# Patient Record
Sex: Male | Born: 1935 | Race: White | Hispanic: No | Smoking: Former smoker
Health system: Southern US, Community
[De-identification: ages and names within clinical notes are randomized; demographics above are authoritative.]

## PROBLEM LIST (undated history)

## (undated) DIAGNOSIS — C689 Malignant neoplasm of urinary organ, unspecified: Secondary | ICD-10-CM

## (undated) DIAGNOSIS — I4891 Unspecified atrial fibrillation: Secondary | ICD-10-CM

## (undated) DIAGNOSIS — I7122 Aneurysm of the aortic arch, without rupture: Secondary | ICD-10-CM

## (undated) DIAGNOSIS — C439 Malignant melanoma of skin, unspecified: Secondary | ICD-10-CM

## (undated) DIAGNOSIS — E785 Hyperlipidemia, unspecified: Secondary | ICD-10-CM

## (undated) DIAGNOSIS — C799 Secondary malignant neoplasm of unspecified site: Principal | ICD-10-CM

## (undated) DIAGNOSIS — K5100 Ulcerative (chronic) pancolitis without complications: Secondary | ICD-10-CM

## (undated) DIAGNOSIS — R918 Other nonspecific abnormal finding of lung field: Secondary | ICD-10-CM

## (undated) DIAGNOSIS — K4090 Unilateral inguinal hernia, without obstruction or gangrene, not specified as recurrent: Secondary | ICD-10-CM

## (undated) DIAGNOSIS — I712 Thoracic aortic aneurysm, without rupture: Secondary | ICD-10-CM

## (undated) DIAGNOSIS — I359 Nonrheumatic aortic valve disorder, unspecified: Secondary | ICD-10-CM

## (undated) DIAGNOSIS — I10 Essential (primary) hypertension: Secondary | ICD-10-CM

## (undated) HISTORY — PX: AORTIC ARCH REPAIR: SHX256

## (undated) HISTORY — DX: Secondary malignant neoplasm of unspecified site: C79.9

## (undated) HISTORY — PX: AORTIC VALVE SURGERY: SHX549

## (undated) HISTORY — DX: Other nonspecific abnormal finding of lung field: R91.8

## (undated) HISTORY — PX: INGUINAL HERNIA REPAIR: SUR1180

## (undated) HISTORY — DX: Malignant neoplasm of urinary organ, unspecified: C68.9

## (undated) HISTORY — DX: Malignant melanoma of skin, unspecified: C43.9

## (undated) HISTORY — DX: Ulcerative (chronic) pancolitis without complications: K51.00

---

## 1975-04-19 HISTORY — PX: THORACOTOMY: SUR1349

## 1998-04-18 HISTORY — PX: CORONARY ARTERY BYPASS GRAFT: SHX141

## 1998-09-14 ENCOUNTER — Ambulatory Visit: Admission: RE | Admit: 1998-09-14 | Discharge: 1998-09-14 | Payer: Self-pay | Admitting: Internal Medicine

## 1999-03-02 ENCOUNTER — Encounter: Payer: Self-pay | Admitting: Thoracic Surgery (Cardiothoracic Vascular Surgery)

## 1999-03-03 ENCOUNTER — Inpatient Hospital Stay (HOSPITAL_COMMUNITY): Admission: RE | Admit: 1999-03-03 | Discharge: 1999-03-10 | Payer: Self-pay | Admitting: *Deleted

## 1999-03-04 ENCOUNTER — Encounter: Payer: Self-pay | Admitting: Thoracic Surgery (Cardiothoracic Vascular Surgery)

## 1999-03-05 ENCOUNTER — Encounter: Payer: Self-pay | Admitting: Thoracic Surgery (Cardiothoracic Vascular Surgery)

## 1999-03-06 ENCOUNTER — Encounter: Payer: Self-pay | Admitting: Thoracic Surgery (Cardiothoracic Vascular Surgery)

## 1999-03-07 ENCOUNTER — Encounter: Payer: Self-pay | Admitting: Thoracic Surgery (Cardiothoracic Vascular Surgery)

## 1999-03-08 ENCOUNTER — Encounter: Payer: Self-pay | Admitting: Cardiothoracic Surgery

## 1999-03-09 ENCOUNTER — Encounter: Payer: Self-pay | Admitting: Cardiothoracic Surgery

## 1999-03-25 ENCOUNTER — Other Ambulatory Visit: Admission: RE | Admit: 1999-03-25 | Discharge: 1999-03-25 | Payer: Self-pay | Admitting: Internal Medicine

## 1999-09-20 ENCOUNTER — Encounter: Payer: Self-pay | Admitting: Internal Medicine

## 1999-09-20 ENCOUNTER — Encounter: Admission: RE | Admit: 1999-09-20 | Discharge: 1999-09-20 | Payer: Self-pay | Admitting: Internal Medicine

## 1999-11-03 ENCOUNTER — Ambulatory Visit (HOSPITAL_COMMUNITY): Admission: RE | Admit: 1999-11-03 | Discharge: 1999-11-03 | Payer: Self-pay | Admitting: Gastroenterology

## 2004-03-02 ENCOUNTER — Ambulatory Visit (HOSPITAL_COMMUNITY): Admission: RE | Admit: 2004-03-02 | Discharge: 2004-03-02 | Payer: Self-pay | Admitting: Gastroenterology

## 2005-08-23 DIAGNOSIS — C439 Malignant melanoma of skin, unspecified: Secondary | ICD-10-CM

## 2005-08-23 HISTORY — DX: Malignant melanoma of skin, unspecified: C43.9

## 2012-06-12 ENCOUNTER — Other Ambulatory Visit: Payer: Self-pay | Admitting: Internal Medicine

## 2012-06-13 ENCOUNTER — Other Ambulatory Visit: Payer: Self-pay | Admitting: Internal Medicine

## 2012-06-13 DIAGNOSIS — R319 Hematuria, unspecified: Secondary | ICD-10-CM

## 2012-06-14 ENCOUNTER — Ambulatory Visit
Admission: RE | Admit: 2012-06-14 | Discharge: 2012-06-14 | Disposition: A | Discharge status: Home / Self Care | Payer: Medicare Other | Source: Ambulatory Visit | Attending: Internal Medicine | Admitting: Internal Medicine

## 2012-06-14 MED ORDER — IOHEXOL 300 MG/ML  SOLN
75.0000 mL | Freq: Once | INTRAMUSCULAR | Status: AC | PRN
  Administered 2012-06-14: 75 mL via INTRAVENOUS

## 2012-06-15 ENCOUNTER — Ambulatory Visit
Admission: RE | Admit: 2012-06-15 | Discharge: 2012-06-15 | Disposition: A | Discharge status: Home / Self Care | Payer: Medicare Other | Source: Ambulatory Visit | Attending: Internal Medicine | Admitting: Internal Medicine

## 2012-06-15 ENCOUNTER — Other Ambulatory Visit: Payer: Self-pay | Admitting: Radiology

## 2012-06-15 ENCOUNTER — Other Ambulatory Visit (HOSPITAL_COMMUNITY): Payer: Self-pay | Admitting: Internal Medicine

## 2012-06-18 ENCOUNTER — Encounter (HOSPITAL_COMMUNITY): Payer: Self-pay | Admitting: Pharmacy Technician

## 2012-06-19 ENCOUNTER — Ambulatory Visit (HOSPITAL_COMMUNITY)
Admission: RE | Admit: 2012-06-19 | Discharge: 2012-06-19 | Disposition: A | Discharge status: Home / Self Care | Payer: Medicare Other | Source: Ambulatory Visit | Attending: Internal Medicine | Admitting: Internal Medicine

## 2012-06-19 ENCOUNTER — Other Ambulatory Visit (HOSPITAL_COMMUNITY): Payer: Self-pay | Admitting: Internal Medicine

## 2012-06-19 ENCOUNTER — Encounter (HOSPITAL_COMMUNITY): Payer: Self-pay

## 2012-06-19 ENCOUNTER — Ambulatory Visit (HOSPITAL_COMMUNITY)
Admission: RE | Admit: 2012-06-19 | Discharge: 2012-06-19 | Disposition: A | Discharge status: Home / Self Care | Payer: Medicare Other | Source: Ambulatory Visit | Attending: Interventional Radiology | Admitting: Interventional Radiology

## 2012-06-19 VITALS — BP 106/42 | HR 74 | Temp 97.9°F | Resp 20 | Ht 70.5 in | Wt 135.0 lb

## 2012-06-19 DIAGNOSIS — R222 Localized swelling, mass and lump, trunk: Secondary | ICD-10-CM | POA: Insufficient documentation

## 2012-06-19 HISTORY — DX: Essential (primary) hypertension: I10

## 2012-06-19 HISTORY — DX: Nonrheumatic aortic valve disorder, unspecified: I35.9

## 2012-06-19 HISTORY — DX: Thoracic aortic aneurysm, without rupture: I71.2

## 2012-06-19 HISTORY — DX: Hyperlipidemia, unspecified: E78.5

## 2012-06-19 HISTORY — DX: Aneurysm of the aortic arch, without rupture: I71.22

## 2012-06-19 LAB — CBC
HCT: 36.7 % — ABNORMAL LOW (ref 39.0–52.0)
Hemoglobin: 12.9 g/dL — ABNORMAL LOW (ref 13.0–17.0)
MCV: 89.7 fL (ref 78.0–100.0)
RDW: 13.5 % (ref 11.5–15.5)
WBC: 6.1 10*3/uL (ref 4.0–10.5)

## 2012-06-19 LAB — APTT: aPTT: 30 seconds (ref 24–37)

## 2012-06-19 LAB — PROTIME-INR: INR: 1.03 (ref 0.00–1.49)

## 2012-06-19 MED ORDER — SODIUM CHLORIDE 0.9 % IV SOLN
Freq: Once | INTRAVENOUS | Status: AC
  Administered 2012-06-19: 08:00:00 via INTRAVENOUS

## 2012-06-19 MED ORDER — LORAZEPAM 2 MG/ML IJ SOLN
INTRAMUSCULAR | Status: AC | PRN
  Administered 2012-06-19: 1 mg via INTRAVENOUS

## 2012-06-19 MED ORDER — MIDAZOLAM HCL 2 MG/2ML IJ SOLN
INTRAMUSCULAR | Status: AC
  Filled 2012-06-19: qty 4

## 2012-06-19 MED ORDER — FENTANYL CITRATE 0.05 MG/ML IJ SOLN
INTRAMUSCULAR | Status: AC | PRN
  Administered 2012-06-19: 25 ug via INTRAVENOUS
  Administered 2012-06-19: 50 ug via INTRAVENOUS
  Administered 2012-06-19: 25 ug via INTRAVENOUS

## 2012-06-19 MED ORDER — FENTANYL CITRATE 0.05 MG/ML IJ SOLN
INTRAMUSCULAR | Status: AC
  Filled 2012-06-19: qty 4

## 2012-06-19 MED ORDER — MIDAZOLAM HCL 2 MG/2ML IJ SOLN
INTRAMUSCULAR | Status: AC | PRN
  Administered 2012-06-19: 1 mg via INTRAVENOUS

## 2012-06-19 NOTE — Procedures (Signed)
Successful CT guided left sided thoracentesis with 800 cc of serous, slightly bloody pleural fluid aspirated.   Technically successful CT guided biopsy of left adrenal gland mass.  No immediate post procedural complications.

## 2012-06-19 NOTE — Progress Notes (Signed)
Removed 850 ml sero-sanguanous fluid via thoracentesis per Dr Grace Isaac

## 2012-06-19 NOTE — Discharge Instructions (Signed)
Thoracentesis A thoracentesis is a procedure to remove excess fluid from the space around the lungs (pleural space). Normally, the pleural space has only a small amount of fluid. Excess fluid in the pleural space can be the result of certain conditions, such as infection, inflammation, heart failure, or cancer. The excess fluid is removed using a needle inserted through the skin and tissue into the pleural space.  A thoracentesis may be done to:  Determine the cause of the excess fluid by examining the fluid.  Relieve symptoms of shortness of breath or pain caused by the excess fluid. LET YOUR CAREGIVER KNOW ABOUT:  Allergies.  Medicines taken including herbs, eyedrops, over-the-counter medicines, and creams.  Use of steroids (by mouth or creams).  Previous problems with anesthetics or numbing medicine.  Possibility of pregnancy, if this applies.  History of blood clots (thrombophlebitis).  History of bleeding or blood problems.  Previous surgery.  Other health problems. RISKS AND COMPLICATIONS  Injury to the lung.  Possible infection.  Possible collapse of a lung (pneumothorax). BEFORE THE PROCEDURE Ask your caregiver if you need to arrive early before your procedure. Inform your caregiver if you have had a frequent cough. If you have had frequent coughing episodes, your caregiver may want you to take a cough suppressant. You may have a chest X-ray to determine location and amount of fluid in the pleural space. PROCEDURE The procedure will take about 30 minutes. This time will vary depending on the amount of fluid that is removed. You may be asked to sit upright and lean slightly forward for the procedure. An area of the back will be cleansed. A numbing medicine (local anesthetic) may then be injected into the skin and tissue. A needle is inserted between your ribs and advanced into the pleural space. You may feel pressure or slight pain as the needle is positioned into the  pleural space. Fluid is removed from the pleural space through the needle. You may feel pressure as the fluid is removed. The needle is withdrawn once the excess fluid has been removed. A sample of the fluid may be sent for examination.  AFTER THE PROCEDURE A chest X-ray may be done. Your recovery will be assessed and monitored. If there are no problems, you should be able to go home shortly after the procedure. Ask when your test results will be ready. Make sure you get your test results. HOME CARE INSTRUCTIONS   You may resume normal diet and activities as allowed.  Only take over-the-counter or prescription medicines for pain, discomfort, or fever as directed by your caregiver. SEEK IMMEDIATE MEDICAL CARE IF:   You develop shortness of breath or chest pain.  There is new drainage or pus coming from the site where fluid was removed.  You notice swelling or increased redness from the site where fluid was removed.  An unexplained temperature of 102 F (38.9 C) or above develops. Document Released: 10/18/2004 Document Revised: 06/27/2011 Document Reviewed: 01/31/2008 [facility] Patient Information 2013 [place], [place].  Biopsy Care After These instructions give you information on caring for yourself after your procedure. Your doctor may also give you more specific instructions. Call your doctor if you have any problems or questions after your procedure. HOME CARE   Return to your normal diet and activities as told by your doctor.  Change your bandages (dressings) as told by your doctor. If skin glue (adhesive) was used, it will peel off in 7 days.  Only take medicines as told by your  doctor.  Ask your doctor when you can bathe and get your wound wet. GET HELP RIGHT AWAY IF:  You see more than a small spot of blood coming from the wound.  You have redness, puffiness (swelling), or pain.  You see yellowish-white fluid (pus) coming from the wound.  You have a fever.  You notice a  bad smell coming from the wound or bandage.  You have a rash, trouble breathing, or any allergy problems. MAKE SURE YOU:   Understand these instructions.  Will watch your condition.  Will get help right away if you are not doing well or get worse. Document Released: 12/07/2010 Document Revised: 06/27/2011 Document Reviewed: 12/07/2010 [facility] Patient Information 2013 [place], [place].

## 2012-06-19 NOTE — H&P (Signed)
Chief Complaint: "I'm here for a biopsy" Referring Physician:Gates HPI: Sean Hunt is an 79 y.o. male with newly found left lung and left adrenal masses. He has been referred for lung biiopsy. He is on Coumadin for his aortic valve repair and has been told to hold his Coumadin for the past 5 days. Upon review of his CT, there is also a moderate pleural effusion which would increase the risk of complications from lung lesion biopsy and the adrenal gland mass seems amenable for biopsy. We discussed with the pt the possibility of performing bx of adrenal lesion as a less risky option, however, we may need to perform thoracentesis and lung biopsy based on today's imaging. PMHx and meds reviewed. The pt feels well otherwise.  Past Medical History:  Past Medical History  Diagnosis Date  . Aortic arch aneurysm   . Aortic valve disorder   . HTN (hypertension)   . Hyperlipidemia     Past Surgical History:  Past Surgical History  Procedure Laterality Date  . Aortic valve surgery    . Aortic arch repair    . Inguinal hernia repair    . Thoracotomy Left 1977    small partial resection    Family History: No family history on file.  Social History:  reports that he has quit smoking. His smoking use included Cigarettes. He smoked 0.00 packs per day. He does not have any smokeless tobacco history on file. He reports that he does not drink alcohol or use illicit drugs.  Allergies: No Known Allergies  Medications: amoxicillin (AMOXIL) 500 MG capsule (Taking) Sig - Route: Take 2,000 mg by mouth once. Pt takes 4 capsules 1 hour prior to dental procedure - Oral Class: Historical Med Number of times this order has been changed since signing: 2 Order [address] aspirin EC 81 MG tablet (Taking) Sig - Route: Take 81 mg by mouth daily. - Oral Class: Historical Med Number of times this order has been changed since signing: 1 Order [address] cholecalciferol (VITAMIN D) 1000 UNITS tablet (Taking) Sig -  Route: Take 1,000 Units by mouth daily. - Oral Class: Historical Med Number of times this order has been changed since signing: 1 Order [address] metoprolol (LOPRESSOR) 50 MG tablet (Taking) Sig - Route: Take 25 mg by mouth daily. - Oral Class: Historical Med Number of times this order has been changed since signing: 1 Order [address] Multiple Vitamin (MULTIVITAMIN WITH MINERALS) TABS (Taking) Sig - Route: Take 1 tablet by mouth daily. - Oral Class: Historical Med Number of times this order has been changed since signing: 1 Order [address] rosuvastatin (CRESTOR) 20 MG tablet (Taking) Sig - Route: Take 20 mg by mouth daily. - Oral Class: Historical Med Number of times this order has been changed since signing: 1 Order [address] warfarin (COUMADIN) 5 MG tablet (Taking) Sig - Route: Take 5-7.5 mg by mouth daily. Sun, mon, wed, thu, fri 1.5 tabs and tue, Sat 1 tab - Oral Class: Historical Med   Please HPI for pertinent positives, otherwise complete 10 system ROS negative.  Physical Exam: Blood pressure 144/81, pulse 95, temperature 97.5 F (36.4 C), temperature source Oral, resp. rate 18, height 5' 10.5" (1.791 m), weight 135 lb (61.236 kg), SpO2 99.00%. Body mass index is 19.09 kg/(m^2).   General Appearance:  Alert, cooperative, no distress, appears stated age  Head:  Normocephalic, without obvious abnormality, atraumatic  ENT: Unremarkable  Lungs:   Clear to auscultation bilaterally, no w/r/r, respirations  unlabored without use of accessory muscles.  Chest Wall:  No tenderness or deformity, left lateral scar from prior thoracotomy  Heart:  Regular rate and rhythm, S1, S2 normal, no murmur, rub or gallop. Carotids 2+ without bruit.  Abdomen:   Soft, non-tender, non distended. Bowel sounds active all four quadrants,  no masses, no organomegaly.  Neurologic: Normal affect, no gross deficits.   No results found for this or any previous visit (from the past 48 hour(s)). No results  found.  Assessment/Plan (L)lung mass and left adrenal mass. Plan for CT guided left adrenal mass biopsy, possible left thoracentesis and lung biopsy. Discussed procedures and risks of both, including bleeding, PTX, need for chest tube. Discussed recovery of procedure as well. Labs pending Consent signed in chart  Brayton El PA-C 06/19/2012, 8:43 AM

## 2012-06-20 ENCOUNTER — Telehealth (HOSPITAL_COMMUNITY): Payer: Self-pay | Admitting: *Deleted

## 2012-07-06 ENCOUNTER — Encounter: Payer: Self-pay | Admitting: Oncology

## 2012-07-06 ENCOUNTER — Other Ambulatory Visit: Payer: Medicare Other | Admitting: Lab

## 2012-07-06 ENCOUNTER — Ambulatory Visit: Payer: Medicare Other | Admitting: Oncology

## 2012-07-06 ENCOUNTER — Ambulatory Visit: Payer: Medicare Other

## 2012-07-06 ENCOUNTER — Telehealth: Payer: Self-pay | Admitting: Oncology

## 2012-07-06 DIAGNOSIS — R918 Other nonspecific abnormal finding of lung field: Secondary | ICD-10-CM

## 2012-07-06 DIAGNOSIS — C439 Malignant melanoma of skin, unspecified: Secondary | ICD-10-CM

## 2012-07-06 DIAGNOSIS — C799 Secondary malignant neoplasm of unspecified site: Secondary | ICD-10-CM

## 2012-07-06 HISTORY — DX: Malignant melanoma of skin, unspecified: C43.9

## 2012-07-06 HISTORY — DX: Secondary malignant neoplasm of unspecified site: C79.9

## 2012-07-06 HISTORY — DX: Other nonspecific abnormal finding of lung field: R91.8

## 2012-07-06 NOTE — Telephone Encounter (Signed)
S/W PT IN RE NP APPT 03/24 @ 3:45 W/DR. HA.  REFERRING DR. Molly Maduro GATES DX- PROBABLE RECURRENT MELANOMA FROM ADRENAL BIOPSY.  WELCOME PACKET IN-OFFICE.

## 2012-07-08 NOTE — Patient Instructions (Addendum)
1.  diagnosis: Metastatic melanoma. 2.  Workup: Brain MRI to rule out spread to the brain, consider PET scan to determine complete stage.  I will ask pathologist to see if tumor has Braf mutation which will correspond to ovral chemo.  3.  Treatment:   *   Yervoy IV once every 3 weeks x 4 doses.  Goal:  Control disease and potential increase survival.   *   Potential side effects include but not limited to:  Autoimmune side effects skin rash, diarrhea, bleeding, liver abnormality, low thyroid function.  *  Clinical trial at an outside institution like [facility] or [place].

## 2012-07-09 ENCOUNTER — Other Ambulatory Visit (HOSPITAL_BASED_OUTPATIENT_CLINIC_OR_DEPARTMENT_OTHER): Payer: Medicare Other | Admitting: Lab

## 2012-07-09 ENCOUNTER — Encounter: Payer: Self-pay | Admitting: Oncology

## 2012-07-09 ENCOUNTER — Ambulatory Visit (HOSPITAL_BASED_OUTPATIENT_CLINIC_OR_DEPARTMENT_OTHER): Payer: Medicare Other | Admitting: Oncology

## 2012-07-09 ENCOUNTER — Ambulatory Visit: Payer: Medicare Other

## 2012-07-09 ENCOUNTER — Telehealth: Payer: Self-pay | Admitting: Oncology

## 2012-07-09 VITALS — BP 134/79 | HR 74 | Temp 97.7°F | Resp 18 | Ht 69.5 in | Wt 138.2 lb

## 2012-07-09 DIAGNOSIS — J90 Pleural effusion, not elsewhere classified: Secondary | ICD-10-CM

## 2012-07-09 DIAGNOSIS — C799 Secondary malignant neoplasm of unspecified site: Secondary | ICD-10-CM

## 2012-07-09 DIAGNOSIS — R918 Other nonspecific abnormal finding of lung field: Secondary | ICD-10-CM

## 2012-07-09 DIAGNOSIS — C7490 Malignant neoplasm of unspecified part of unspecified adrenal gland: Secondary | ICD-10-CM

## 2012-07-09 DIAGNOSIS — N3289 Other specified disorders of bladder: Secondary | ICD-10-CM

## 2012-07-09 DIAGNOSIS — R222 Localized swelling, mass and lump, trunk: Secondary | ICD-10-CM

## 2012-07-09 DIAGNOSIS — Z85820 Personal history of malignant melanoma of skin: Secondary | ICD-10-CM

## 2012-07-09 LAB — CBC WITH DIFFERENTIAL/PLATELET
Basophils Absolute: 0 10*3/uL (ref 0.0–0.1)
Eosinophils Absolute: 0 10*3/uL (ref 0.0–0.5)
HGB: 12.9 g/dL — ABNORMAL LOW (ref 13.0–17.1)
MONO#: 0.6 10*3/uL (ref 0.1–0.9)
NEUT#: 6.5 10*3/uL (ref 1.5–6.5)
RBC: 4.23 10*6/uL (ref 4.20–5.82)
RDW: 13.6 % (ref 11.0–14.6)
WBC: 7.8 10*3/uL (ref 4.0–10.3)
lymph#: 0.6 10*3/uL — ABNORMAL LOW (ref 0.9–3.3)

## 2012-07-09 LAB — COMPREHENSIVE METABOLIC PANEL (CC13)
Albumin: 3.4 g/dL — ABNORMAL LOW (ref 3.5–5.0)
BUN: 17.6 mg/dL (ref 7.0–26.0)
Calcium: 9.6 mg/dL (ref 8.4–10.4)
Chloride: 103 mEq/L (ref 98–107)
Glucose: 95 mg/dl (ref 70–99)
Potassium: 4.4 mEq/L (ref 3.5–5.1)
Sodium: 143 mEq/L (ref 136–145)
Total Protein: 7.3 g/dL (ref 6.4–8.3)

## 2012-07-09 NOTE — Progress Notes (Signed)
[facility]  Telephone:([phone] [phone]   MEDICAL ONCOLOGY - INITIAL CONSULATION    Referral MD:  Dr. Marden Noble, M.D.   Reason for Referral: Suspected metastatic melanoma.    HPI:  [name] is an 79 year old man with history of melanoma. He recently developed nonintentional weight loss since November 2013 about 30 pounds. He had normal appetite. He developed left upper quadrant pain was crampy. Eventually he underwent workup with CT scan of the chest on 06/14/2012 which showed a 5.1 cm rounded mass in the posterior medial left lower lobe. There was moderate sized left pleural effusion. There was no definite mediastinal or hilar adenopathy. There was an enlarged left adrenal gland.  He underwent on 06/19/2012 left thoracentesis with negative cytology. He had CT-guided left adrenal gland biopsy on 06/19/2012 with pathology case number [id number] which showed high-grade malignancy. By [facility]. staining, the cells were positive for S100 and negative for cytokeratin 5/6, cytokeratin 7, cytokeratin AE1/AE3, HMB 45, TTF-1, melan-A.  This was thought to be consistent with melanoma. He was thus kindly referred to the cancer Center for evaluation.  Sean Hunt presented to the clinic today for the first time today with his daughter.  He reported feeling well. He has mild left flank pain and left upper quadrant pain. The pain is crampy. It is mild. It is only chronic pain medication. He does not radiate. It is stable since past 3 months. He is not smoking about the timing of his melanoma diagnosis in the sternum.  He gave me the contact for his dermatologist Dr. Ellen Henri.  He does have occasional dizziness without syncope, frequent fall, gait abnormality. He is still independent of all activities daily living.  Patient denies fever, anorexia,  fatigue, headache, visual changes, confusion, drenching night sweats, palpable lymph node swelling, mucositis, odynophagia,  dysphagia, nausea vomiting, jaundice, palpitation, shortness of breath, dyspnea on exertion, productive cough, gum bleeding, epistaxis, hematemesis, hemoptysis, abdominal swelling, early satiety, melena, hematochezia, hematuria, skin rash, spontaneous bleeding, joint swelling, joint pain, heat or cold intolerance, bowel bladder incontinence, back pain, focal motor weakness, paresthesia, depression.      Past Medical History  Diagnosis Date  . Aortic arch aneurysm   . Aortic valve disorder   . HTN (hypertension)   . Hyperlipidemia   . Metastatic melanoma 07/06/2012  . Lung mass 07/06/2012  :  Past Surgical History  Procedure Laterality Date  . Aortic valve surgery    . Aortic arch repair    . Inguinal hernia repair    . Thoracotomy Left 1977    small partial resection, for "clot?"  . Coronary artery bypass graft  2000  :  Current Outpatient Prescriptions  Medication Sig Dispense Refill  . amoxicillin (AMOXIL) 500 MG capsule Take 2,000 mg by mouth once. Pt takes 4 capsules 1 hour prior to dental procedure      . aspirin EC 81 MG tablet Take 81 mg by mouth daily.      . cholecalciferol (VITAMIN D) 1000 UNITS tablet Take 1,000 Units by mouth daily.      . metoprolol (LOPRESSOR) 50 MG tablet Take 25 mg by mouth daily.      . Multiple Vitamin (MULTIVITAMIN WITH MINERALS) TABS Take 1 tablet by mouth daily.      . rosuvastatin (CRESTOR) 20 MG tablet Take 20 mg by mouth daily.      Marland Kitchen warfarin (COUMADIN) 5 MG tablet Take 5-7.5 mg by mouth daily. Sun, mon, wed,  thu, fri 1.5 tabs and tue,  Sat 1 tab       No current facility-administered medications for this visit.     No Known Allergies:  Family History  Problem Relation Age of Onset  . Alzheimer's disease Mother   . Stroke Father   . Hypertension Father   . Cancer Sister     breast cancer;   . Cancer Brother     bladder cancer; lung cancer; melanoma.   :  History   Social History  . Marital Status: Widowed    Spouse Name:  N/A    Number of Children: 3  . Years of Education: N/A   Occupational History  . retired     [employer].    Social History Main Topics  . Smoking status: Former Smoker -- 1.00 packs/day for 25 years    Types: Cigarettes    Quit date: 04/19/1975  . Smokeless tobacco: Not on file  . Alcohol Use: No     Comment: Remote history  . Drug Use: No  . Sexually Active: Not on file   Other Topics Concern  . Not on file   Social History Narrative  . No narrative on file  :   Exam: ECOG 0   General:  well-nourished in no acute distress.  Eyes:  no scleral icterus.  ENT:  There were no oropharyngeal lesions.  Neck was without thyromegaly.  Lymphatics:  Negative cervical, supraclavicular or axillary adenopathy.  Respiratory: There was mild story crackle in the left lower base a decrease in tympany. Cardiovascular:  Irregularly irregular, S1/S2, without murmur, rub or gallop.  There was no pedal edema.  GI:  abdomen was soft, flat, nontender, nondistended, without organomegaly.  Muscoloskeletal:  no spinal tenderness of palpation of vertebral spine.  Skin exam was without echymosis, petichae.  Neuro exam was nonfocal.  Patient was able to get on and off exam table without assistance.  Gait was normal.  Patient was alerted and oriented.  Attention was good.   Language was appropriate.  Mood was normal without depression.  Speech was not pressured.  Thought content was not tangential.     Lab Results  Component Value Date   WBC 7.8 07/09/2012   HGB 12.9* 07/09/2012   HCT 38.0* 07/09/2012   PLT 178 07/09/2012    Dg Chest 1 View  06/19/2012  *RADIOLOGY REPORT*  Clinical Data: Post thoracentesis  CHEST - 1 VIEW  Comparison: 06/12/2012  Findings: Cardiomediastinal silhouette is stable.  Status post CABG again noted.  Poorly visualized nodular mass left lower lobe retrocardiac again noted.  Small residual left pleural effusion with left basilar atelectasis.  No diagnostic pneumothorax.  No pulmonary  edema.  IMPRESSION: Status post CABG again noted.  Poorly visualized nodular mass left lower lobe retrocardiac again noted.  Small residual left pleural effusion with left basilar atelectasis.  No diagnostic pneumothorax.  No pulmonary edema.   Original Report Authenticated By: Natasha Mead, M.D.    Ct Chest W Contrast  06/14/2012  *RADIOLOGY REPORT*  Clinical Data: Abnormal chest x-ray with a rounded mass-like opacity at the left lung base on recent chest x-ray  CT CHEST WITH CONTRAST  Technique:  Multidetector CT imaging of the chest was performed following the standard protocol during bolus administration of intravenous contrast.  Contrast: 75mL OMNIPAQUE IOHEXOL 300 MG/ML  SOLN  Comparison: Chest x-ray of 06/12/2012 and 03/02/2012  Findings: At the site questioned by chest x-ray there is a rounded soft tissue mass  within the posterior medial left lower lobe measuring 5.1 x 3.9 x 4.2 cm, consistent with primary lung carcinoma.  There is a moderate-sized left pleural effusion present with some volume loss at the left lung base as well chronic interstitial changes are noted primarily at the lung bases consistent with fibrosis.  Linear scarring is noted in the lung apex left lung apex.  Mild changes of centrilobular emphysema are noted.  No additional lung lesion is evident.  Since the central airway is patent. There is a moderate sized hiatal hernia present. An old healed posterior left fifth rib fracture is noted.  On soft tissue window images, the thyroid gland is unremarkable. The thoracic aorta opacifies with advanced atherosclerotic disease noted. Changes of aortic valve replacement and repair of the ascending aorta is noted.  There is mild cardiomegaly present.  No definite mediastinal or hilar adenopathy is seen.  However, on scans through the upper abdomen there is enlargement of the left adrenal gland measuring 2.9 x 2.8 cm with an attenuation of 47 HU, suspicious for metastatic involvement of the left  adrenal gland. The left upper pole renal cyst is noted posteriorly.  IMPRESSION:  1.  5.1 cm rounded mass in the posterior medial left lower lobe consistent with primary lung carcinoma. 2.  Moderate sized left pleural effusion. 3.  No definite mediastinal or hilar adenopathy. 4.  Moderate sized hiatal hernia. 5.  Enlarged left adrenal gland suspicious for metastatic involvement.   Original Report Authenticated By: Dwyane Dee, M.D.    US Renal  06/15/2012  *RADIOLOGY REPORT*  Clinical Data: Hematuria  RENAL/URINARY TRACT ULTRASOUND COMPLETE  Comparison:  None  Findings:  Right Kidney:  No hydronephrosis is seen.  The right kidney measures 12.8 cm sagittally.  However there does appear to be a soft tissue mass within the proximal right ureter of 2.4 x 1.2 x 1.7 cm.  No definite blood flow is seen, and consideration would be that of blood clot versus possible tumor.  Left Kidney:  No hydronephrosis is seen.  The left kidney measures 10.8 cm.  There is a cyst in the upper pole of 4.0 cm.  Bladder:  The urinary bladder is unremarkable.  Left ureteral jet is present.  Prevoid urine volume is 501 ml, with postvoid volume of 185 ml.  IMPRESSION:  1.  Soft tissue filling defect within the proximal right ureter without definite blood flow.  This may represent a blood clot, but a urothelial tumor cannot be excluded. 2.  No hydronephrosis. 3.  Postvoid urine volume of 185 ml.   Original Report Authenticated By: Dwyane Dee, M.D.     Assessment and Plan:   1.  Diagnosis: Metastatic melanoma. -Workup: Brain MRI to rule out spread to the brain, consider PET scan to determine complete stage.  I will contact his dermatologist for information about this primary melanoma. I will ask pathologist to see if tumor has Braf mutation which will correspond to ovral chemo.    -Treatment:   *   Yervoy IV once every 3 weeks x 4 doses.  Goal:  Control disease and potential increase survival.   *   Potential side effects include but  not limited to:  Autoimmune side effects skin rash, diarrhea, bleeding, liver abnormality, low thyroid function.  *  Clinical trial at an outside institution like [facility] or [place].   2.   left lower lobe lung mass and pleural effusion: Thoracentesis pathology was negative.  However given the solitary nature of the  lung mass, I need to rule out primary lung cancer. I referred him to IR for CT-guided lung mass biopsy.  3.   Soft tissue filling defect within the proximal right ureter:   I will refer him to urology for further evaluation.  4.  Afib:  He is on Coumadin and rate control with metoprolol.  We need to hold off Coumadin for upcoming biopsy procedure. He had no history of stroke or thrombosis. There is no strong indication for bridging with Lovenox when he is off of Coumadin.  5.  followup: In about 2 weeks.    The length of time of the face-to-face encounter was 60 minutes. More than 50% of time was spent counseling and coordination of care.     Thank you for this referral.

## 2012-07-09 NOTE — Progress Notes (Signed)
Checked in new pt with no financial concerns. °

## 2012-07-09 NOTE — Telephone Encounter (Signed)
C/D 07/09/12 for appt. 07/09/12

## 2012-07-10 ENCOUNTER — Telehealth: Payer: Self-pay | Admitting: *Deleted

## 2012-07-10 ENCOUNTER — Other Ambulatory Visit (HOSPITAL_COMMUNITY)
Admission: RE | Admit: 2012-07-10 | Discharge: 2012-07-10 | Disposition: A | Discharge status: Home / Self Care | Payer: Medicare Other | Source: Ambulatory Visit | Attending: Oncology | Admitting: Oncology

## 2012-07-10 DIAGNOSIS — C7490 Malignant neoplasm of unspecified part of unspecified adrenal gland: Secondary | ICD-10-CM | POA: Insufficient documentation

## 2012-07-10 NOTE — Telephone Encounter (Signed)
Called pt to inform him ok to hold coumadin for biopsy per Dr. Gaylyn Rong.  Instructed to take tomorrow, Wed, then hold until day after his Biopsy.  Restart Tues April 1st.  Pt verbalized understanding.

## 2012-07-10 NOTE — Telephone Encounter (Signed)
Pt left VM wanting Dr. Gaylyn Rong to know that the Surgeon who did his chest surgery was not Dr. Pasty Spillers,  But a surgeon at [facility]) and he does not remember his name.

## 2012-07-10 NOTE — Telephone Encounter (Signed)
No need.  It's not relevant for what I see him now for.  Thanks.

## 2012-07-10 NOTE — Telephone Encounter (Signed)
Called pt for verbal consent release of information for Path reports from his dermatologist,  Dr. Alean Rinne.  Pt gave consent, but could not remember how long ago he had his skin lesions removed.  He thinks it is w/i the last 10 years.  Faxed release of information to Dr. Alean Rinne at fax [phone],  Requesting all operative and pathology reports in the past 10 years.

## 2012-07-10 NOTE — Telephone Encounter (Signed)
Call from [facility] in [place]  Pt scheduled for MRI brain on 3/27 and CT biopsy on 3/31.  Pt needs to be off coumadin x 4 days prior to biopsy and has been instructed to take his last coumadin tomorrow 3/26 If ok w/ Dr. Gaylyn Rong and/or Dr. Eldridge Dace.

## 2012-07-10 NOTE — Telephone Encounter (Signed)
Yes. OK with me to be off of Coumadin before biopsy.  He should just resume Coumadin afterward.  No bridging is needed.  Thanks.

## 2012-07-11 ENCOUNTER — Encounter (HOSPITAL_COMMUNITY): Payer: Self-pay | Admitting: Pharmacy Technician

## 2012-07-12 ENCOUNTER — Other Ambulatory Visit: Payer: Self-pay | Admitting: Oncology

## 2012-07-12 ENCOUNTER — Telehealth: Payer: Self-pay | Admitting: Oncology

## 2012-07-12 ENCOUNTER — Ambulatory Visit (HOSPITAL_COMMUNITY)
Admission: RE | Admit: 2012-07-12 | Discharge: 2012-07-12 | Disposition: A | Discharge status: Home / Self Care | Payer: Medicare Other | Source: Ambulatory Visit | Attending: Oncology | Admitting: Oncology

## 2012-07-12 DIAGNOSIS — G319 Degenerative disease of nervous system, unspecified: Secondary | ICD-10-CM | POA: Insufficient documentation

## 2012-07-12 DIAGNOSIS — N3289 Other specified disorders of bladder: Secondary | ICD-10-CM

## 2012-07-12 DIAGNOSIS — R918 Other nonspecific abnormal finding of lung field: Secondary | ICD-10-CM

## 2012-07-12 DIAGNOSIS — C799 Secondary malignant neoplasm of unspecified site: Secondary | ICD-10-CM

## 2012-07-12 DIAGNOSIS — R5381 Other malaise: Secondary | ICD-10-CM | POA: Insufficient documentation

## 2012-07-12 DIAGNOSIS — R279 Unspecified lack of coordination: Secondary | ICD-10-CM | POA: Insufficient documentation

## 2012-07-12 DIAGNOSIS — I6789 Other cerebrovascular disease: Secondary | ICD-10-CM | POA: Insufficient documentation

## 2012-07-12 DIAGNOSIS — I10 Essential (primary) hypertension: Secondary | ICD-10-CM | POA: Insufficient documentation

## 2012-07-12 DIAGNOSIS — C439 Malignant melanoma of skin, unspecified: Secondary | ICD-10-CM | POA: Insufficient documentation

## 2012-07-12 MED ORDER — GADOBENATE DIMEGLUMINE 529 MG/ML IV SOLN
15.0000 mL | Freq: Once | INTRAVENOUS | Status: AC | PRN
  Administered 2012-07-12: 12 mL via INTRAVENOUS

## 2012-07-12 NOTE — Telephone Encounter (Signed)
gve the pt his appt with dr ha on 07/26/2012@10 :00am

## 2012-07-13 ENCOUNTER — Other Ambulatory Visit: Payer: Self-pay | Admitting: Radiology

## 2012-07-16 ENCOUNTER — Ambulatory Visit (HOSPITAL_COMMUNITY)
Admission: RE | Admit: 2012-07-16 | Discharge: 2012-07-16 | Disposition: A | Discharge status: Home / Self Care | Payer: Medicare Other | Source: Ambulatory Visit | Attending: Oncology | Admitting: Oncology

## 2012-07-16 ENCOUNTER — Telehealth: Payer: Self-pay | Admitting: *Deleted

## 2012-07-16 ENCOUNTER — Encounter (HOSPITAL_COMMUNITY): Payer: Self-pay

## 2012-07-16 DIAGNOSIS — N3289 Other specified disorders of bladder: Secondary | ICD-10-CM

## 2012-07-16 DIAGNOSIS — I10 Essential (primary) hypertension: Secondary | ICD-10-CM | POA: Insufficient documentation

## 2012-07-16 DIAGNOSIS — C799 Secondary malignant neoplasm of unspecified site: Secondary | ICD-10-CM

## 2012-07-16 DIAGNOSIS — E785 Hyperlipidemia, unspecified: Secondary | ICD-10-CM | POA: Insufficient documentation

## 2012-07-16 DIAGNOSIS — I739 Peripheral vascular disease, unspecified: Secondary | ICD-10-CM | POA: Insufficient documentation

## 2012-07-16 DIAGNOSIS — R918 Other nonspecific abnormal finding of lung field: Secondary | ICD-10-CM

## 2012-07-16 DIAGNOSIS — Z79899 Other long term (current) drug therapy: Secondary | ICD-10-CM | POA: Insufficient documentation

## 2012-07-16 DIAGNOSIS — C7800 Secondary malignant neoplasm of unspecified lung: Secondary | ICD-10-CM | POA: Insufficient documentation

## 2012-07-16 DIAGNOSIS — I4891 Unspecified atrial fibrillation: Secondary | ICD-10-CM | POA: Insufficient documentation

## 2012-07-16 LAB — PROTIME-INR
INR: 1.07 (ref 0.00–1.49)
Prothrombin Time: 13.8 seconds (ref 11.6–15.2)

## 2012-07-16 MED ORDER — FENTANYL CITRATE 0.05 MG/ML IJ SOLN
INTRAMUSCULAR | Status: AC | PRN
  Administered 2012-07-16: 50 ug via INTRAVENOUS

## 2012-07-16 MED ORDER — MIDAZOLAM HCL 2 MG/2ML IJ SOLN
INTRAMUSCULAR | Status: AC
  Filled 2012-07-16: qty 6

## 2012-07-16 MED ORDER — HYDROCODONE-ACETAMINOPHEN 5-325 MG PO TABS
1.0000 | ORAL_TABLET | ORAL | Status: DC | PRN
  Filled 2012-07-16: qty 2

## 2012-07-16 MED ORDER — SODIUM CHLORIDE 0.9 % IV SOLN
INTRAVENOUS | Status: DC
  Administered 2012-07-16: 10:00:00 via INTRAVENOUS

## 2012-07-16 MED ORDER — MIDAZOLAM HCL 2 MG/2ML IJ SOLN
INTRAMUSCULAR | Status: AC | PRN
  Administered 2012-07-16: 1 mg via INTRAVENOUS

## 2012-07-16 MED ORDER — FENTANYL CITRATE 0.05 MG/ML IJ SOLN
INTRAMUSCULAR | Status: AC
  Filled 2012-07-16: qty 6

## 2012-07-16 NOTE — Procedures (Signed)
Procedure:  CT guided core biopsy of left lung mass Findings:  5.7 cm LLL mass.  Sampled via 17 G needle with 18 G core biopsy x 4.  No complications.

## 2012-07-16 NOTE — H&P (Signed)
Sean Hunt is an 79 y.o. male.   Chief Complaint: "I'm here for a lung biopsy" HPI: Patient with history of metastatic melanoma and recent CT chest revealing a 5.1 cm left lower lobe lung mass presents today for CT guided left lung biopsy.  Past Medical History  Diagnosis Date  . Aortic arch aneurysm   . Aortic valve disorder   . HTN (hypertension)   . Hyperlipidemia   . Metastatic melanoma 07/06/2012  . Lung mass 07/06/2012  . Melanoma 2006    chest;     Past Surgical History  Procedure Laterality Date  . Aortic valve surgery    . Aortic arch repair    . Inguinal hernia repair    . Thoracotomy Left 1977    small partial resection, for "clot?"  . Coronary artery bypass graft  2000    Family History  Problem Relation Age of Onset  . Alzheimer's disease Mother   . Stroke Father   . Hypertension Father   . Cancer Sister     breast cancer;   . Cancer Brother     bladder cancer; lung cancer; melanoma.    Social History:  reports that he quit smoking about 37 years ago. His smoking use included Cigarettes. He has a 25 pack-year smoking history. He does not have any smokeless tobacco history on file. He reports that he does not drink alcohol or use illicit drugs.  Allergies: No Known Allergies  Current outpatient prescriptions:aspirin EC 81 MG tablet, Take 81 mg by mouth daily., Disp: , Rfl: ;  cholecalciferol (VITAMIN D) 1000 UNITS tablet, Take 1,000 Units by mouth daily., Disp: , Rfl: ;  metoprolol (LOPRESSOR) 50 MG tablet, Take 25 mg by mouth daily before breakfast. Takes 1/2 tablet, Disp: , Rfl: ;  Multiple Vitamin (MULTIVITAMIN WITH MINERALS) TABS, Take 1 tablet by mouth daily., Disp: , Rfl:  rosuvastatin (CRESTOR) 20 MG tablet, Take 20 mg by mouth daily before breakfast. , Disp: , Rfl: ;  amoxicillin (AMOXIL) 500 MG capsule, Take 2,000 mg by mouth once. Pt takes 4 capsules 1 hour prior to dental procedure, Disp: , Rfl: ;  warfarin (COUMADIN) 5 MG tablet, Take 5-7.5 mg by  mouth every evening. Sun, mon, wed, thu, fri 1.5 tabs and tue,  Sat 1 tab, Disp: , Rfl:  Current facility-administered medications:0.9 %  sodium chloride infusion, , Intravenous, Continuous, Brayton El, PA-C, Last Rate: 20 mL/hr at 07/16/12 1610 Facility-Administered Medications Ordered in Other Encounters: fentaNYL (SUBLIMAZE) 0.05 MG/ML injection, , , , ;  midazolam (VERSED) 2 MG/2ML injection, , , ,    Results for orders placed during the hospital encounter of 07/16/12 (from the past 48 hour(s))  APTT     Status: None   Collection Time    07/16/12  9:30 AM      Result Value Range   aPTT 30  24 - 37 seconds  PROTIME-INR     Status: None   Collection Time    07/16/12  9:30 AM      Result Value Range   Prothrombin Time 13.8  11.6 - 15.2 seconds   INR 1.07  0.00 - 1.49   No results found.  Review of Systems  Constitutional: Negative for fever and chills.  HENT:       Occ HA's  Respiratory: Positive for shortness of breath. Negative for cough.   Cardiovascular: Negative for chest pain.  Gastrointestinal: Negative for nausea and vomiting.       Occ abd  pain  Musculoskeletal:       Occ back pain   Vitals: BP 130/76  HR 60  TEMP 97.8  O2 SATS 98%RA Physical Exam  Constitutional: He is oriented to person, place, and time.  Thin WM in NAD  Cardiovascular:  irreg irreg  Respiratory: Effort normal.  Sl dim BS left base, rt clear  GI: Soft. Bowel sounds are normal. There is no tenderness.  Musculoskeletal: Normal range of motion. He exhibits no edema.  Neurological: He is oriented to person, place, and time.     Assessment/Plan Pt with hx metastatic melanoma and 5.1 cm left lower lobe lung mass noted on recent CT chest. Plan is for CT guided biopsy of the lung mass today. Details/risks of procedure d/w pt/daughter with their understanding and consent.  ALLRED,D KEVIN 07/16/2012, 10:56 AM

## 2012-07-16 NOTE — H&P (Signed)
Agree 

## 2012-07-16 NOTE — Telephone Encounter (Signed)
Received op report,  Biopsy reports from Dr. Beatris Ship office as requested and forwarded to Dr. Gaylyn Rong.   There was no record of any Melanoma,  Only basal cell carcinoma.  Called Dr. Beatris Ship office and s/w Lorayne Marek,  Who looked thru pt's chart to find any mention of Melanoma and she could not.   She did find a report of skin lesion removed from pt's chest and faxed [place] that report.  It showed only a keratosis and no mention of Melanoma to be found form Dr. Beatris Ship records.   Records given to Dr. Gaylyn Rong for review.

## 2012-07-17 ENCOUNTER — Telehealth: Payer: Self-pay | Admitting: Oncology

## 2012-07-17 NOTE — Telephone Encounter (Signed)
S/w pt re appt w/Dr. Pamala Hurry @ [facility] 4/2 @ 9:30am. Returned call to [facility] triage nurse @ [facility] letting her know pt will come to appt.

## 2012-07-17 NOTE — Telephone Encounter (Signed)
S/w Asher Muir, triage nurse @ [facility] requesting appt ASAP (per 3/27 pof). Per Asher Muir she will look up notes and [place] results and call me back w/an appt.

## 2012-07-18 ENCOUNTER — Other Ambulatory Visit: Payer: Self-pay | Admitting: Urology

## 2012-07-18 NOTE — Progress Notes (Signed)
Received report of surgical pathology from [facility]; forwarded to Dr. Gaylyn Rong.

## 2012-07-19 ENCOUNTER — Encounter (HOSPITAL_COMMUNITY)
Admission: RE | Admit: 2012-07-19 | Discharge: 2012-07-19 | Disposition: A | Discharge status: Home / Self Care | Payer: Medicare Other | Source: Ambulatory Visit | Attending: Urology | Admitting: Urology

## 2012-07-19 ENCOUNTER — Other Ambulatory Visit (HOSPITAL_COMMUNITY): Payer: Self-pay | Admitting: Urology

## 2012-07-19 ENCOUNTER — Ambulatory Visit (HOSPITAL_COMMUNITY)
Admission: RE | Admit: 2012-07-19 | Discharge: 2012-07-19 | Disposition: A | Discharge status: Home / Self Care | Payer: Medicare Other | Source: Ambulatory Visit | Attending: Urology | Admitting: Urology

## 2012-07-19 ENCOUNTER — Encounter (HOSPITAL_COMMUNITY): Payer: Self-pay

## 2012-07-19 DIAGNOSIS — N2889 Other specified disorders of kidney and ureter: Secondary | ICD-10-CM | POA: Insufficient documentation

## 2012-07-19 DIAGNOSIS — Z951 Presence of aortocoronary bypass graft: Secondary | ICD-10-CM | POA: Insufficient documentation

## 2012-07-19 DIAGNOSIS — J90 Pleural effusion, not elsewhere classified: Secondary | ICD-10-CM | POA: Insufficient documentation

## 2012-07-19 DIAGNOSIS — Z01812 Encounter for preprocedural laboratory examination: Secondary | ICD-10-CM | POA: Insufficient documentation

## 2012-07-19 DIAGNOSIS — I700 Atherosclerosis of aorta: Secondary | ICD-10-CM | POA: Insufficient documentation

## 2012-07-19 DIAGNOSIS — R222 Localized swelling, mass and lump, trunk: Secondary | ICD-10-CM | POA: Insufficient documentation

## 2012-07-19 DIAGNOSIS — I10 Essential (primary) hypertension: Secondary | ICD-10-CM | POA: Insufficient documentation

## 2012-07-19 HISTORY — DX: Unspecified atrial fibrillation: I48.91

## 2012-07-19 HISTORY — DX: Unilateral inguinal hernia, without obstruction or gangrene, not specified as recurrent: K40.90

## 2012-07-19 LAB — BASIC METABOLIC PANEL
CO2: 30 mEq/L (ref 19–32)
Calcium: 9.6 mg/dL (ref 8.4–10.5)
Creatinine, Ser: 0.71 mg/dL (ref 0.50–1.35)
GFR calc non Af Amer: 86 mL/min — ABNORMAL LOW (ref >90)
Sodium: 138 mEq/L (ref 135–145)

## 2012-07-19 NOTE — Progress Notes (Signed)
Chest xray results 07-19-2012  Faxed  to dr borden by epic

## 2012-07-19 NOTE — Progress Notes (Addendum)
ekg 07-02-2012 dr Jocelyn Lamer on chart lov note dr Jocelyn Lamer cadrdiology 07-02-2012 on chart Echo 05-09-2008 and stress test dr Jocelyn Lamer on chart lov note dr ha oncology 07-09-2012 epic

## 2012-07-19 NOTE — Patient Instructions (Addendum)
20 Sean Hunt  07/19/2012   Your procedure is scheduled on: 07-23-2012  Report to [facility] at 215 PM  Call this number if you have problems the morning of surgery [phone]   Remember:   Do not eat food  :After Midnight.   clear liquids midnight until 1045 am day of surgery, then nothing by mouth.   Take these medicines the morning of surgery with A SIP OF WATER: metoprolol, crestor                                SEE Reader PREPARING FOR SURGERY SHEET   Do not wear jewelry, make-up or nail polish.  Do not wear lotions, powders, or perfumes. You may wear deodorant.   Men may shave face and neck.  Do not bring valuables to the hospital.  Contacts, dentures or bridgework may not be worn into surgery.  Leave suitcase in the car. After surgery it may be brought to your room.  For patients admitted to the hospital, checkout time is 11:00 AM the day of discharge.   Patients discharged the day of surgery will not be allowed to drive home.  Name and phone number of your driver:debbie lewis daughter cell [phone]  Special Instructions: N/A   Please read over the following fact sheets that you were given: MRSA Information.  Call Cain Sieve RN pre op nurse if needed [phone]    FAILURE TO FOLLOW THESE INSTRUCTIONS MAY RESULT IN THE CANCELLATION OF YOUR SURGERY. PATIENT SIGNATURE___________________________________________

## 2012-07-21 ENCOUNTER — Other Ambulatory Visit: Payer: Self-pay | Admitting: Oncology

## 2012-07-22 NOTE — H&P (Signed)
Chief Complaint  Right ureteral mass   History of Present Illness Sean Hunt is a pleasant 79 year old gentleman who is seen today at the request of Dr. Gaylyn Rong for a questionable right ureteral mass. He does have a history of melanoma and underwent resection of a melanoma on his chest a few years ago at [facility]. He  he began unintentionally losing weight in November 2013 that lost approximately 30 pounds by February of 2014. He underwent an evaluation including a CT scan of the chest which did show a 5.1 cm left lower lobe pulmonary mass. He also was apparently noted to have an enlarged left adrenal gland and underwent a CT-guided biopsy on 06/19/12. These findings were consistent with high-grade malignancy and immunohistochemical staining was consistent with melanoma. He also had an ultrasound of the abdomen which was apparently performed 22 microscopic hematuria. This was performed on 06/15/12 and indicated no evidence of clear hydronephrosis but did demonstrate what appeared to be a probable proximal right ureteral mass versus blood clot. He has not undergone CT imaging of the abdomen. He also had a biopsy of his pulmonary mass on Monday and that pathology is reviewed today. Findings are also consistent with high-grade malignancy and appeared to be consistent with the adrenal biopsy which suggest metastatic melanoma. He denies any flank pain, gross hematuria, abdominal pain, or personal/family history of GU malignancy except a brother who did have bladder cancer.  He has undergone a previous aortic valve replacement with a porcine valve. He does receive SBE prophylaxis and is chronically on anticoagulation with Coumadin which may be related to a history of atrial fibrillation. His cardiologist is Dr. Everette Rank.  He has been off his Coumadin until her earlier this week in preparation for his lung biopsy.    Past Medical History Problems  1. History of  Aortic Aneurysm 441.9 2. History of   Esophageal Reflux 530.81 3. History of  Hyperlipidemia 272.4 4. History of  Hypertension 401.9 5. History of  Lung Mass 786.6 6. History of  Metastatic Malignant Melanoma 172.9  Surgical History Problems  1. History of  Aortic Valve Repair 2. History of  Coronary Artery Surgery 3. History of  Inguinal Hernia Repair 4. History of  Thoracotomy  Current Meds 1. Aspirin 81 MG Oral Tablet; Therapy: (Recorded:02Apr2014) to 2. Calcium + D TABS; Therapy: (Recorded:02Apr2014) to 3. Coumadin 5 MG Oral Tablet; Therapy: (Recorded:02Apr2014) to 4. Crestor 20 MG Oral Tablet; Therapy: (Recorded:02Apr2014) to 5. Ipratropium Bromide 0.03 % Nasal Solution; Therapy: (Recorded:02Apr2014) to 6. Metoprolol Tartrate 50 MG Oral Tablet; Therapy: (Recorded:02Apr2014) to 7. Multi Vitamin Mens TABS; Therapy: (Recorded:02Apr2014) to  Allergies Medication  1. No Known Drug Allergies  Family History Problems  1. Maternal history of  Alzheimer's Disease 2. Fraternal history of  Bladder Cancer V16.52 3. Sororal history of  Breast Cancer V16.3 4. Paternal history of  Hypertension V17.49 5. Fraternal history of  Lung Cancer V16.1 6. Fraternal history of  Malignant Melanoma Of The Skin V16.8 7. Paternal history of  Stroke Syndrome V17.1  Social History Problems    Former Smoker V15.82 smoked about a pack a day x 25 years; quit smoking in 1977   Marital History - Widowed Denied    History of  Alcohol Use  Review of Systems Genitourinary, constitutional, skin, eye, otolaryngeal, hematologic/lymphatic, cardiovascular, pulmonary, endocrine, musculoskeletal, gastrointestinal, neurological and psychiatric system(s) were reviewed and pertinent findings if present are noted.  Genitourinary: no hematuria.  Gastrointestinal: no nausea and no vomiting.  Constitutional:  feeling tired (fatigue) and recent weight loss.  Hematologic/Lymphatic: a tendency to easily bruise.  Respiratory: shortness of breath.     Vitals Vital Signs [Data Includes: Last 1 Day]  02Apr2014 10:04AM  BMI Calculated: 19.9 BSA Calculated: 1.79 Height: 5 ft 10 in Weight: 139 lb  Blood Pressure: 115 / 62 Temperature: 98.3 F Heart Rate: 111  Physical Exam Constitutional: Well nourished and well developed . No acute distress.  ENT:. The ears and nose are normal in appearance.  Neck: The appearance of the neck is normal and no neck mass is present.  Pulmonary: No respiratory distress and normal respiratory rhythm and effort.  His heart rateis normal but his rhythm is irregular.  Abdomen: The abdomen is soft and nontender. No masses are palpated. No CVA tenderness. No hernias are palpable. No hepatosplenomegaly noted.  Lymphatics: The femoral and inguinal nodes are not enlarged or tender.  Skin: Normal skin turgor, no visible rash and no visible skin lesions.  Neuro/Psych:. Mood and affect are appropriate.    Results/Data Urine [Data Includes: Last 1 Day]   02Apr2014  COLOR YELLOW   APPEARANCE CLEAR   SPECIFIC GRAVITY 1.025   pH 6.0   GLUCOSE NEG mg/dL  BILIRUBIN NEG   KETONE TRACE mg/dL  BLOOD SMALL   PROTEIN TRACE mg/dL  UROBILINOGEN 1 mg/dL  NITRITE NEG   LEUKOCYTE ESTERASE NEG   SQUAMOUS EPITHELIAL/HPF RARE   WBC 0-2 WBC/hpf  RBC 0-2 RBC/hpf  BACTERIA RARE   CRYSTALS NONE SEEN   CASTS NONE SEEN   Other MUCUS NOTED        I have extensively reviewed his medical records, CT scan and ultrasound images, as well as his pathology reports. Findings are as dictated above.   Assessment Assessed  1. 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 UA With REFLEX  Done: 02Apr2014 09:55AM Neoplasm Of The Ureter (239.5)  2. Follow-up Office  Follow-up  Done: 02Apr2014  Discussion/Summary 1. Possible right ureteral mass: I have recommended that he undergo a dedicated CT scan of the abdomen and pelvis for further evaluation to help determine whether he has an intrinsic or possibly an  extrinsic mass in the vicinity of his proximal right ureter. He does not appear to have significant hydronephrosis. Assuming that he does have a mass, we have discussed proceeding with cystoscopy, retrograde pyelography, possible biopsy, and possible ureteral stent placement both to help confirm a diagnosis and to provide appropriate renal drainage and maintain renal function especially considering that he may require systemic chemotherapy in the future. Since he is currently off Coumadin, he will be tentatively scheduled for this procedure early next week and will remain off Coumadin. However, his CT scan will be performed later this week and will be reviewed prior to proceeding with this procedure. If it does not appear that he has an intrinsic ureteral mass, I likely will not proceed with operative intervention.  CC: Dr. Johnella Moloney Dr. Jethro Bolus     Verified Results AU CT-HEMATURIA PROTOCOL1 03Apr2014 12:00AM1 Lyda Perone  [Jul 19, 2012 12:58PM Laverle Patter, Loletta Harper] Please let patient know that CT scan does demonstrate a mass in the right kidney as was seen on ultrasound. This confirms that we should proceed as planned on Monday.   Test Name Result Flag Reference  ** RADIOLOGY REPORT BY [facility] **   *RADIOLOGY REPORT*  Clinical Data: Metastatic melanoma involving the left adrenal gland and left lung mass. . Microhematuria  CT ABDOMEN  AND PELVIS WITHOUT AND WITH CONTRAST  Technique: Multidetector CT imaging of the abdomen and pelvis was performed without contrast material in one or both body regions, followed by contrast material(s) and further sections in one or both body regions.  Contrast: 125 ml Isovue  Comparison: Chest CT 06/14/2012  Findings:  Renal: Non-IV contrast images demonstrate no nephrolithiasis or ureterolithiasis. Cortical phase imaging demonstrates a rounded 13 mm density in the right renal pelvis which demonstrates postcontrast enhancement. There  is no enhancing renal cortical lesion on the left or right. There is a nonenhancing cyst extending from the left kidney measuring 3.5 cm. Delayed pyelogram phase imaging demonstrates a filling defect within the right renal pelvis as described above. No additional filling defects. No filling defect in the bladder.  There is a large right pleural effusion. Left lung mass measures 5.2 x 4.8 cm increased from 4.7 x 4.7 cm on prior remeasured. There is a large left pleural effusion which is increased. There is an aortic stent in place. The pericardium appears normal.  At the right lung base medially there is a 2.2 cm enhancing mass in the azygo-esophageal recess. This is concerning for metastatic node or pleural metastasis (image #1).  Small peripheral enhancing lesion in the right hepatic lobe measuring 10 mm (image 28, series 3). Additional central enhancing focus measuring 8 mm (image 21, series 3). Gallbladder, pancreas, and spleen are normal. Left adrenal metastasis measures 3.9 x 3.7 cm not changed from 3.9 x 3.7 cm on prior.  The stomach, small bowel, and colon are normal.  There are small periportal lymph nodes which are similar to prior.  The pelvis, the prostate gland bladder normal. No pelvic lymphadenopathy. Review of bone windows demonstrates no aggressive osseous lesions.  IMPRESSION:  1.. Enhancing ovoid mass within the right renal collecting system is concerning for metastasis or primary urogenital neoplasm.  2. Left lower lobe lung mass (melanoma metastasis) measures slightly larger. 3. Additional mass in the medial right lung base concerning for metastatic node or pleural metastasis. 4. Stable left adrenal metastasis. 5. Subtle enhancing lesions within the right hepatic lobe are nonspecific but cannot exclude metastasis. Further characterization could be achieved with a contrast enhanced MRI of the abdomen.   Original Report Authenticated By: Genevive Bi, M.D.     1. Amended By: Heloise Purpura; 07/19/2012 12:58 PMEST  Signatures Electronically signed by : Heloise Purpura, M.D.; Jul 19 2012 12:58PM

## 2012-07-23 ENCOUNTER — Ambulatory Visit (HOSPITAL_COMMUNITY)
Admission: RE | Admit: 2012-07-23 | Discharge: 2012-07-23 | Disposition: A | Discharge status: Home / Self Care | Payer: Medicare Other | Source: Ambulatory Visit | Attending: Urology | Admitting: Urology

## 2012-07-23 ENCOUNTER — Encounter (HOSPITAL_COMMUNITY): Payer: Self-pay | Admitting: Anesthesiology

## 2012-07-23 ENCOUNTER — Ambulatory Visit (HOSPITAL_COMMUNITY): Payer: Medicare Other | Admitting: Anesthesiology

## 2012-07-23 ENCOUNTER — Encounter (HOSPITAL_COMMUNITY)
Admission: RE | Disposition: A | Discharge status: Home / Self Care | Payer: Self-pay | Source: Ambulatory Visit | Attending: Urology

## 2012-07-23 ENCOUNTER — Other Ambulatory Visit: Payer: Self-pay | Admitting: Oncology

## 2012-07-23 ENCOUNTER — Encounter (HOSPITAL_COMMUNITY): Payer: Self-pay | Admitting: *Deleted

## 2012-07-23 ENCOUNTER — Encounter: Payer: Self-pay | Admitting: Oncology

## 2012-07-23 DIAGNOSIS — Z7901 Long term (current) use of anticoagulants: Secondary | ICD-10-CM | POA: Insufficient documentation

## 2012-07-23 DIAGNOSIS — Z85820 Personal history of malignant melanoma of skin: Secondary | ICD-10-CM | POA: Insufficient documentation

## 2012-07-23 DIAGNOSIS — E785 Hyperlipidemia, unspecified: Secondary | ICD-10-CM | POA: Insufficient documentation

## 2012-07-23 DIAGNOSIS — Z954 Presence of other heart-valve replacement: Secondary | ICD-10-CM | POA: Insufficient documentation

## 2012-07-23 DIAGNOSIS — I4891 Unspecified atrial fibrillation: Secondary | ICD-10-CM | POA: Insufficient documentation

## 2012-07-23 DIAGNOSIS — I10 Essential (primary) hypertension: Secondary | ICD-10-CM | POA: Insufficient documentation

## 2012-07-23 DIAGNOSIS — C7970 Secondary malignant neoplasm of unspecified adrenal gland: Secondary | ICD-10-CM | POA: Insufficient documentation

## 2012-07-23 DIAGNOSIS — Z79899 Other long term (current) drug therapy: Secondary | ICD-10-CM | POA: Insufficient documentation

## 2012-07-23 DIAGNOSIS — K219 Gastro-esophageal reflux disease without esophagitis: Secondary | ICD-10-CM | POA: Insufficient documentation

## 2012-07-23 DIAGNOSIS — C679 Malignant neoplasm of bladder, unspecified: Secondary | ICD-10-CM | POA: Insufficient documentation

## 2012-07-23 HISTORY — PX: HOLMIUM LASER APPLICATION: SHX5852

## 2012-07-23 HISTORY — PX: CYSTOSCOPY WITH RETROGRADE PYELOGRAM, URETEROSCOPY AND STENT PLACEMENT: SHX5789

## 2012-07-23 LAB — PROTIME-INR
INR: 1.05 (ref 0.00–1.49)
Prothrombin Time: 13.6 seconds (ref 11.6–15.2)

## 2012-07-23 SURGERY — CYSTOURETEROSCOPY, WITH RETROGRADE PYELOGRAM AND STENT INSERTION
Anesthesia: General | Laterality: Right | Wound class: Clean Contaminated

## 2012-07-23 MED ORDER — FENTANYL CITRATE 0.05 MG/ML IJ SOLN
INTRAMUSCULAR | Status: DC | PRN
  Administered 2012-07-23: 50 ug via INTRAVENOUS
  Administered 2012-07-23 (×4): 25 ug via INTRAVENOUS

## 2012-07-23 MED ORDER — LABETALOL HCL 5 MG/ML IV SOLN
INTRAVENOUS | Status: DC | PRN
  Administered 2012-07-23 (×2): 2.5 mg via INTRAVENOUS

## 2012-07-23 MED ORDER — LACTATED RINGERS IV SOLN
INTRAVENOUS | Status: DC
  Administered 2012-07-23: 16:00:00 via INTRAVENOUS
  Administered 2012-07-23: 1000 mL via INTRAVENOUS

## 2012-07-23 MED ORDER — SODIUM CHLORIDE 0.9 % IR SOLN
Status: DC | PRN
  Administered 2012-07-23: 1000 mL

## 2012-07-23 MED ORDER — PHENYLEPHRINE HCL 10 MG/ML IJ SOLN
INTRAMUSCULAR | Status: DC | PRN
  Administered 2012-07-23: 80 ug via INTRAVENOUS
  Administered 2012-07-23: 40 ug via INTRAVENOUS

## 2012-07-23 MED ORDER — LIDOCAINE HCL (CARDIAC) 10 MG/ML IV SOLN
INTRAVENOUS | Status: DC | PRN
  Administered 2012-07-23: 50 mg via INTRAVENOUS

## 2012-07-23 MED ORDER — ONDANSETRON HCL 4 MG/2ML IJ SOLN
INTRAMUSCULAR | Status: DC | PRN
  Administered 2012-07-23: 4 mg via INTRAVENOUS

## 2012-07-23 MED ORDER — PROPOFOL 10 MG/ML IV BOLUS
INTRAVENOUS | Status: DC | PRN
  Administered 2012-07-23: 170 mg via INTRAVENOUS

## 2012-07-23 MED ORDER — HYDROCODONE-ACETAMINOPHEN 5-325 MG PO TABS: 1.0000 | ORAL_TABLET | Freq: Four times a day (QID) | ORAL | Status: DC | PRN

## 2012-07-23 MED ORDER — SODIUM CHLORIDE 0.9 % IV SOLN
2.0000 g | INTRAVENOUS | Status: AC
  Administered 2012-07-23: 2 g via INTRAVENOUS
  Filled 2012-07-23: qty 2000

## 2012-07-23 MED ORDER — FENTANYL CITRATE 0.05 MG/ML IJ SOLN: 25.0000 ug | INTRAMUSCULAR | Status: DC | PRN

## 2012-07-23 MED ORDER — IOHEXOL 300 MG/ML  SOLN
INTRAMUSCULAR | Status: AC
  Filled 2012-07-23: qty 1

## 2012-07-23 MED ORDER — CIPROFLOXACIN HCL 500 MG PO TABS: 500.0000 mg | ORAL_TABLET | Freq: Two times a day (BID) | ORAL | Status: DC

## 2012-07-23 MED ORDER — MIDAZOLAM HCL 5 MG/5ML IJ SOLN
INTRAMUSCULAR | Status: DC | PRN
  Administered 2012-07-23: 0.5 mg via INTRAVENOUS

## 2012-07-23 MED ORDER — SODIUM CHLORIDE 0.9 % IR SOLN
Status: DC | PRN
  Administered 2012-07-23: 3000 mL

## 2012-07-23 MED ORDER — PROMETHAZINE HCL 25 MG/ML IJ SOLN: 6.2500 mg | INTRAMUSCULAR | Status: DC | PRN

## 2012-07-23 MED ORDER — HYDROCODONE-ACETAMINOPHEN 5-325 MG PO TABS
1.0000 | ORAL_TABLET | Freq: Once | ORAL | Status: AC
  Administered 2012-07-23: 1 via ORAL
  Filled 2012-07-23: qty 1

## 2012-07-23 MED ORDER — IOHEXOL 300 MG/ML  SOLN
INTRAMUSCULAR | Status: DC | PRN
  Administered 2012-07-23: 10 mL

## 2012-07-23 MED ORDER — GENTAMICIN SULFATE 40 MG/ML IJ SOLN
5.0000 mg/kg | INTRAVENOUS | Status: AC
  Administered 2012-07-23: 313 mg via INTRAVENOUS
  Filled 2012-07-23: qty 7.84

## 2012-07-23 SURGICAL SUPPLY — 21 items
ADAPTER CATH URET PLST 4-6FR (CATHETERS) ×1 IMPLANT
ADPR CATH URET STRL DISP 4-6FR (CATHETERS)
BAG URO CATCHER STRL LF (DRAPE) ×2 IMPLANT
BASKET STNLS GEMINI 4WIRE 3FR (BASKET) ×1 IMPLANT
BASKET ZERO TIP NITINOL 2.4FR (BASKET) IMPLANT
BSKT STON RTRVL GEM 120X11 3FR (BASKET) ×1
BSKT STON RTRVL ZERO TP 2.4FR (BASKET)
CATH INTERMIT  6FR 70CM (CATHETERS) ×1 IMPLANT
CLOTH BEACON ORANGE TIMEOUT ST (SAFETY) ×2 IMPLANT
DRAPE CAMERA CLOSED 9X96 (DRAPES) ×2 IMPLANT
GLOVE BIOGEL M STRL SZ7.5 (GLOVE) ×2 IMPLANT
GOWN PREVENTION PLUS XLARGE (GOWN DISPOSABLE) ×1 IMPLANT
GOWN STRL NON-REIN LRG LVL3 (GOWN DISPOSABLE) ×4 IMPLANT
GUIDEWIRE ANG ZIPWIRE 038X150 (WIRE) IMPLANT
GUIDEWIRE STR DUAL SENSOR (WIRE) ×2 IMPLANT
LASER FIBER DISP (UROLOGICAL SUPPLIES) ×1 IMPLANT
MANIFOLD NEPTUNE II (INSTRUMENTS) ×2 IMPLANT
PACK CYSTO (CUSTOM PROCEDURE TRAY) ×2 IMPLANT
SHEATH ACCESS URETERAL 38CM (SHEATH) ×1 IMPLANT
STENT CONTOUR 6FRX24X.038 (STENTS) ×1 IMPLANT
TUBING CONNECTING 10 (TUBING) ×2 IMPLANT

## 2012-07-23 NOTE — Anesthesia Postprocedure Evaluation (Signed)
  Anesthesia Post-op Note  Patient: Sean Hunt  Procedure(s) Performed: Procedure(s) (LRB): CYSTOSCOPY WITH RETROGRADE PYELOGRAM, URETEROSCOPY WITH RESECTIOIN OF RENAL PELVIC TUMOR AND RIGHT STENT PLACEMENT  (Right) HOLMIUM LASER APPLICATION OF RIGHT RENAL PELVIC TUMOR (Right)  Patient Location: PACU  Anesthesia Type: General  Level of Consciousness: awake and alert   Airway and Oxygen Therapy: Patient Spontanous Breathing  Post-op Pain: mild  Post-op Assessment: Post-op Vital signs reviewed, Patient's Cardiovascular Status Stable, Respiratory Function Stable, Patent Airway and No signs of Nausea or vomiting  Last Vitals:  Filed Vitals:   07/23/12 1709  BP: 128/56  Pulse: 70  Temp: 37 C  Resp:     Post-op Vital Signs: stable   Complications: No apparent anesthesia complications

## 2012-07-23 NOTE — Transfer of Care (Signed)
Immediate Anesthesia Transfer of Care Note  Patient: Sean Hunt  Procedure(s) Performed: Procedure(s): CYSTOSCOPY WITH RETROGRADE PYELOGRAM, URETEROSCOPY WITH RESECTIOIN OF RENAL PELVIC TUMOR AND RIGHT STENT PLACEMENT  (Right) HOLMIUM LASER APPLICATION OF RIGHT RENAL PELVIC TUMOR (Right)  Patient Location: PACU  Anesthesia Type:General  Level of Consciousness: awake, alert , oriented and patient cooperative  Airway & Oxygen Therapy: Patient Spontanous Breathing and Patient connected to face mask oxygen  Post-op Assessment: Report given to PACU RN, Post -op Vital signs reviewed and stable and Patient moving all extremities X 4  Post vital signs: stable  Complications: No apparent anesthesia complications

## 2012-07-23 NOTE — Interval H&P Note (Signed)
History and Physical Interval Note:  07/23/2012 2:44 PM  Sean Hunt  has presented today for surgery, with the diagnosis of RIGHT URETERAL MASS  The various methods of treatment have been discussed with the patient and family. After consideration of risks, benefits and other options for treatment, the patient has consented to  Procedure(s) with comments: CYSTOSCOPY WITH RETROGRADE PYELOGRAM, URETEROSCOPY AND STENT PLACEMENT (Right) - URETEROSCOPY WITH BIOPSY  as a surgical intervention .  The patient's history has been reviewed, patient examined, no change in status, stable for surgery.  I have reviewed the patient's chart and labs.  Questions were answered to the patient's satisfaction.     [name],LES

## 2012-07-23 NOTE — Anesthesia Preprocedure Evaluation (Signed)
Anesthesia Evaluation  Patient identified by MRN, date of birth, ID band Patient awake    Reviewed: Allergy & Precautions, H&P , NPO status , Patient's Chart, lab work & pertinent test results  Airway Mallampati: II TM Distance: >3 FB Neck ROM: Full    Dental no notable dental hx.    Pulmonary former smoker,  breath sounds clear to auscultation  Pulmonary exam normal       Cardiovascular Exercise Tolerance: Good hypertension, Pt. on medications and Pt. on home beta blockers + Peripheral Vascular Disease + dysrhythmias Atrial Fibrillation Rhythm:Regular Rate:Normal     Neuro/Psych negative neurological ROS  negative psych ROS   GI/Hepatic negative GI ROS, Neg liver ROS,   Endo/Other  negative endocrine ROS  Renal/GU negative Renal ROS  negative genitourinary   Musculoskeletal negative musculoskeletal ROS (+)   Abdominal   Peds negative pediatric ROS (+)  Hematology negative hematology ROS (+)   Anesthesia Other Findings   Reproductive/Obstetrics negative OB ROS                           Anesthesia Physical Anesthesia Plan  ASA: III  Anesthesia Plan: General   Post-op Pain Management:    Induction: Intravenous  Airway Management Planned: LMA  Additional Equipment:   Intra-op Plan:   Post-operative Plan: Extubation in OR  Informed Consent: I have reviewed the patients History and Physical, chart, labs and discussed the procedure including the risks, benefits and alternatives for the proposed anesthesia with the patient or authorized representative who has indicated his/her understanding and acceptance.   Dental advisory given  Plan Discussed with: CRNA  Anesthesia Plan Comments:         Anesthesia Quick Evaluation

## 2012-07-24 ENCOUNTER — Encounter: Payer: Self-pay | Admitting: Oncology

## 2012-07-24 ENCOUNTER — Other Ambulatory Visit: Payer: Self-pay | Admitting: Oncology

## 2012-07-24 NOTE — Op Note (Signed)
Preoperative diagnosis: Filling defect of right renal pelvis/proximal ureter  Postoperative diagnosis: Right renal pelvic tumor  Procedures: 1. Cystoscopy 2. Right retrograde pyelography with interpretation 3. Right ureteroscopy 4. Ureteroscopic resection and laser ablation of right renal pelvic tumor 5. Right ureteral stent placement ( 6 x 24)  Surgeon: Dr. Rolly Salter, Montez Hageman.  Anesthesia: General  Complications: None  Specimens: Right renal pelvic tumor  Disposition of specimens: Pathology  Intraoperative findings: Right retrograde pyelography demonstrated in normal caliber ureter without filling defects. The right renal collecting system was very abnormal with a large filling defect seen in the renal pelvis. Mild hydronephrosis and calyceal dilation was appreciated.  Indication: Sean Hunt is an 79 year old gentleman with a history of melanoma who was recently found to have both a left adrenal mass and a pulmonary mass. He has undergone biopsy of each of these sites with findings thus far consistent with metastatic melanoma. An ultrasound evaluation did reveal a possible mass within the renal pelvis/proximal ureter of the right kidney. He underwent a CT scan of the abdomen and pelvis with delayed contrast imaging which confirmed a filling defect in the renal pelvis/proximal ureter. I therefore recommended further evaluation including the procedures as listed above. We reviewed the potential risks, complications, alternative options, and expected recovery process associated with these procedures and he gave his informed consent.  Description of procedure: He was taken to the operating room and a general anesthetic was administered. He was given preoperative antibiotics, placed in the dorsal lithotomy position, and prepped and draped in the usual sterile fashion. Next a preoperative timeout was performed. Cystourethroscopy was performed which revealed a normal urethra aside from mild BPH.  Examination of the bladder was performed systematically and revealed the ureteral orifices to be in their expected anatomic locations. There was mild to moderate trabeculation to the bladder without tumors, stones, or additional mucosal pathology. Attention turned to the right ureteral orifice which was cannulated with a 6 French ureteral catheter.  Omnipaque contrast was then injected and revealed findings as dictated above. Specifically, there was noted to be a large filling defect in the renal pelvis/proximal ureter. A 0.38 sensor guidewire was then advanced up the ureter into the renal pelvis without difficulty under fluoroscopic guidance. A 12/14 ureteral access sheath was advanced into the proximal ureter below the level of the filling defect. The digital flexible ureteroscope was advanced up through the access sheath into the renal pelvis and a large papillary and frondular tumor was identified within the renal pelvis consistent with probable low-grade urothelial carcinoma. This tumor was then resected and removed. Firstly, a Gemini stone basket was used to remove the majority of the tumor is in multiple sections which were sent for permanent pathologic analysis. Once the majority of the tumor had been removed, the remaining portion of the tumor and abnormal areas along the urothelium of the collecting system were ablated with the holmium laser utilizing a 200  fiber at a setting of 1.5 J and 0.6 Hz. Once all visible abnormalities were ablated, the renal pelvis was again carefully examined as well as the remaining calyceal systems with no remaining abnormalities identified. The ureteral access sheath was removed and the 0.38 sensor guidewire left in place. This wire was then backloaded on the cystoscope and a 6 x 24 double-J ureteral stent was advanced over the wire using Seldinger technique and positioned appropriately under fluoroscopic and cystoscopic guidance. The wire was removed with an adequate curl  noted in the renal pelvis as well  as within the bladder. The patient's bladder was emptied and the procedure ended. He tolerated the procedure well without complications. He was able to be awakened and transferred to the recovery unit in satisfactory condition.

## 2012-07-25 NOTE — Patient Instructions (Addendum)
1. Diagnosis: Metastatic melanoma with disease to the left lung and left adrenal gland. 2. Treatment recommendation: *  Yervoy IV every 3 weeks x 4 doses.  Sean Hunt is immunotherapy.  Potential side effects include but not limited to skin rash, diarrhea, low thyroid function, fatigue.  *  After Sean Hunt, if there are residual disease, we may consider surgery. *  If there is rapid disease progression after Yervoy, we may consider chemo. *  There is also Il-2 (another immunotherapy).  However, this is rather toxic in elderly patients.  *  There may be a clinical trial at [facility] if disease progresses after Yervoy.

## 2012-07-26 ENCOUNTER — Telehealth: Payer: Self-pay | Admitting: *Deleted

## 2012-07-26 ENCOUNTER — Encounter: Payer: Self-pay | Admitting: Oncology

## 2012-07-26 ENCOUNTER — Ambulatory Visit (HOSPITAL_BASED_OUTPATIENT_CLINIC_OR_DEPARTMENT_OTHER): Payer: Medicare Other | Admitting: Oncology

## 2012-07-26 ENCOUNTER — Telehealth: Payer: Self-pay | Admitting: Oncology

## 2012-07-26 DIAGNOSIS — C799 Secondary malignant neoplasm of unspecified site: Secondary | ICD-10-CM

## 2012-07-26 DIAGNOSIS — C439 Malignant melanoma of skin, unspecified: Secondary | ICD-10-CM

## 2012-07-26 DIAGNOSIS — C7970 Secondary malignant neoplasm of unspecified adrenal gland: Secondary | ICD-10-CM

## 2012-07-26 DIAGNOSIS — C689 Malignant neoplasm of urinary organ, unspecified: Secondary | ICD-10-CM

## 2012-07-26 DIAGNOSIS — I4891 Unspecified atrial fibrillation: Secondary | ICD-10-CM

## 2012-07-26 DIAGNOSIS — C7800 Secondary malignant neoplasm of unspecified lung: Secondary | ICD-10-CM

## 2012-07-26 HISTORY — DX: Malignant neoplasm of urinary organ, unspecified: C68.9

## 2012-07-26 NOTE — Telephone Encounter (Signed)
gv and printed pt appt schedule for April thru June...emailed michelle to add tx...pt aware

## 2012-07-26 NOTE — Telephone Encounter (Signed)
Per staff message and POF I have scheduled appts.  [name]  

## 2012-07-26 NOTE — Telephone Encounter (Signed)
Open by misake 

## 2012-07-26 NOTE — Progress Notes (Signed)
[facility]  Telephone:([phone]) [phone] Fax:(336) [phone]   OFFICE PROGRESS NOTE   Cc:  GATES,ROBERT NEVILL, MD  DIAGNOSIS:  Metastatic melanoma; Braf mutation negative.    PAST THERAPY: biopsy only   CURRENT THERAPY:  Due to start chemo soon.   INTERVAL HISTORY: Sean Hunt 79 y.o. male returns for regular follow up with his son and daughter to discuss result of work up so far.  He underwent on 07/23/2012 cystoscopy, right retrograde pyelography, right ureteroscopy, ureteroscopic resection and laser ablation of right renal pelvic tumor, and right ureteral stent placement ( 6 x 24).  He now has light lemonade hematuria from the recent procedure. He denies a blood clot in the urine. He also has moderate crampy nonradiating right flank pain which he is taking pain medication for. He complains of fatigue. He is still independent of activities of personal hygiene. He spent a lot of awake time at rest. He has low appetite and some weight loss. The res of the 14-point review of system was negative.   Past Medical History  Diagnosis Date  . Aortic arch aneurysm   . Aortic valve disorder   . HTN (hypertension)   . Hyperlipidemia   . Metastatic melanoma 07/06/2012    to left adrenal gland and left lung; BRAF mutation negative.   . Lung mass 07/06/2012  . Melanoma 08/23/2005    Resected by Dr. Jorja Loa.  Clarks level IV; Breslows 1.73mm; no ulcertaion. Positive margins.  No further info as to whether he had re-excision.   . Atrial fibrillation   . Right inguinal hernia     Past Surgical History  Procedure Laterality Date  . Aortic valve surgery    . Aortic arch repair    . Thoracotomy Left 1977    small partial resection, for "clot?"  . Coronary artery bypass graft  2000    x 4  . Inguinal hernia repair Bilateral yrs ago  . Cystoscopy with retrograde pyelogram, ureteroscopy and stent placement Right 07/23/2012    Procedure: CYSTOSCOPY WITH RETROGRADE PYELOGRAM, URETEROSCOPY  WITH RESECTIOIN OF RENAL PELVIC TUMOR AND RIGHT STENT PLACEMENT ;  Surgeon: Crecencio Mc, MD;  Location: [facility];  Service: Urology;  Laterality: Right;  . Holmium laser application Right 07/23/2012    Procedure: HOLMIUM LASER APPLICATION OF RIGHT RENAL PELVIC TUMOR;  Surgeon: Crecencio Mc, MD;  Location: [facility];  Service: Urology;  Laterality: Right;    Current Outpatient Prescriptions  Medication Sig Dispense Refill  . ciprofloxacin (CIPRO) 500 MG tablet Take 1 tablet (500 mg total) by mouth 2 (two) times daily.  6 tablet  0  . HYDROcodone-acetaminophen (NORCO/VICODIN) 5-325 MG per tablet Take 1-2 tablets by mouth every 6 (six) hours as needed for pain.  25 tablet  0  . metoprolol (LOPRESSOR) 50 MG tablet Take 25 mg by mouth daily before breakfast. Takes 1/2 tablet      . rosuvastatin (CRESTOR) 20 MG tablet Take 20 mg by mouth daily before breakfast.       . aspirin 81 MG tablet Take 81 mg by mouth daily.      . cholecalciferol (VITAMIN D) 1000 UNITS tablet Take 1,000 Units by mouth daily.       No current facility-administered medications for this visit.    ALLERGIES:  has No Known Allergies.  REVIEW OF SYSTEMS:  The rest of the 14-point review of system was negative.   There were no vitals filed for this visit. Wt Readings from Last  3 Encounters:  07/19/12 137 lb 12.8 oz (62.506 kg)  07/09/12 138 lb 3.2 oz (62.687 kg)  06/19/12 135 lb (61.236 kg)   ECOG Performance status:  ECOG 1-2  PHYSICAL EXAMINATION:  General:  well-nourished in no acute distress.  Eyes:  no scleral icterus.  ENT:  There were no oropharyngeal lesions.  Neck was without thyromegaly.  Lymphatics:  Negative cervical, supraclavicular or axillary adenopathy.  Respiratory: lungs were clear bilaterally without wheezing or crackles.  Cardiovascular:  Regular rate and rhythm, S1/S2, without murmur, rub or gallop.  There was no pedal edema.  GI:  abdomen was soft, flat, nontender, nondistended, without organomegaly.   Muscoloskeletal:  no spinal tenderness of palpation of vertebral spine.  Skin exam was without echymosis, petichae.  Neuro exam was nonfocal.  Patient was able to get on and off exam table without assistance.  Gait was normal.  Patient was alerted and oriented.  Attention was good.   Language was appropriate.  Mood was normal without depression.  Speech was not pressured.  Thought content was not tangential.         LABORATORY/RADIOLOGY DATA:  Lab Results  Component Value Date   WBC 7.8 07/09/2012   HGB 12.9* 07/09/2012   HCT 38.0* 07/09/2012   PLT 178 07/09/2012   GLUCOSE 133* 07/19/2012   ALKPHOS 83 07/09/2012   ALT 9 07/09/2012   AST 15 07/09/2012   NA 138 07/19/2012   K 3.9 07/19/2012   CL 100 07/19/2012   CREATININE 0.71 07/19/2012   BUN 11 07/19/2012   CO2 30 07/19/2012   INR 1.05 07/23/2012     ASSESSMENT AND PLAN:    1. Metastatic melanoma with disease to the left lung and left adrenal gland; biopsy proven; Braf negative. Treatment recommendation: *  Yervoy IV every 3 weeks x 4 doses.  [name] is immunotherapy.  Potential side effects include but not limited to skin rash, diarrhea, low thyroid function, fatigue.  *  After [name], if there are residual disease, we may consider surgery.  He did not want any surgery to remove metastatic cancer at this time.  *  If there is rapid disease progression after Yervoy, we may consider chemo. *  There is also Il-2 (another immunotherapy).  However, this is rather toxic in elderly patients.  He did not want this option today.  *  There may be a clinical trial at [facility] if disease progresses after Yervoy.  He prefers to stay here at [facility] and receive Yervoy.   2.  Right renal pelvic tumor: s/p resection by Dr. Laverle Patter.  Path pending.  I personally discussed with Dr. Laverle Patter today who recommended that I proceed with whichever I need to do for his metastatic melanoma.   3. Afib: He is on Coumadin and rate control with metoprolol.  In the future, if he has  bleeding complication for metastatic cancer or its treatment, I will need to discuss with his cardiologist about what to do with his Coumadin.  4.  Code status:  Mr. Wamble had previously decided to be DNR/DNI in case of cardiopulmonary arrest.  His children and I agreed with his decision given incurable metastatic melanoma and also possibly concurrent urothelial carcinoma.    The length of time of the face-to-face encounter was 40 minutes. More than 50% of time was spent counseling and coordination of care.

## 2012-07-30 ENCOUNTER — Other Ambulatory Visit: Payer: Medicare Other

## 2012-07-31 ENCOUNTER — Telehealth: Payer: Self-pay | Admitting: Dietician

## 2012-07-31 NOTE — Telephone Encounter (Signed)
Brief Outpatient Oncology Nutrition Note  Patient has been identified to be at risk on malnutrition screen.  Wt Readings from Last 10 Encounters:  07/19/12 137 lb 12.8 oz (62.506 kg)  07/09/12 138 lb 3.2 oz (62.687 kg)  06/19/12 135 lb (61.236 kg)    Called and spoke with patient regarding nutrition and importance of weight maintenance.  Encouraged high calorie, high protein foods and instructed patient on this.  Patient states he is trying to eat well.  Patient to call if he has questions.  Oran Rein, RD, LDN

## 2012-08-01 ENCOUNTER — Other Ambulatory Visit (HOSPITAL_BASED_OUTPATIENT_CLINIC_OR_DEPARTMENT_OTHER): Payer: Medicare Other | Admitting: Lab

## 2012-08-01 ENCOUNTER — Telehealth: Payer: Self-pay | Admitting: Oncology

## 2012-08-01 ENCOUNTER — Ambulatory Visit (HOSPITAL_BASED_OUTPATIENT_CLINIC_OR_DEPARTMENT_OTHER): Payer: Medicare Other | Admitting: Oncology

## 2012-08-01 ENCOUNTER — Ambulatory Visit (HOSPITAL_BASED_OUTPATIENT_CLINIC_OR_DEPARTMENT_OTHER): Payer: Medicare Other

## 2012-08-01 ENCOUNTER — Other Ambulatory Visit: Payer: Self-pay | Admitting: *Deleted

## 2012-08-01 VITALS — BP 122/42 | HR 94 | Temp 98.6°F | Resp 20 | Ht 70.0 in | Wt 135.8 lb

## 2012-08-01 DIAGNOSIS — C439 Malignant melanoma of skin, unspecified: Secondary | ICD-10-CM

## 2012-08-01 DIAGNOSIS — C7970 Secondary malignant neoplasm of unspecified adrenal gland: Secondary | ICD-10-CM

## 2012-08-01 DIAGNOSIS — C799 Secondary malignant neoplasm of unspecified site: Secondary | ICD-10-CM

## 2012-08-01 DIAGNOSIS — Z5112 Encounter for antineoplastic immunotherapy: Secondary | ICD-10-CM

## 2012-08-01 DIAGNOSIS — I4891 Unspecified atrial fibrillation: Secondary | ICD-10-CM

## 2012-08-01 DIAGNOSIS — C7800 Secondary malignant neoplasm of unspecified lung: Secondary | ICD-10-CM

## 2012-08-01 LAB — CBC WITH DIFFERENTIAL/PLATELET
BASO%: 0.5 % (ref 0.0–2.0)
Eosinophils Absolute: 0.1 10*3/uL (ref 0.0–0.5)
HCT: 35 % — ABNORMAL LOW (ref 38.4–49.9)
HGB: 11.6 g/dL — ABNORMAL LOW (ref 13.0–17.1)
LYMPH%: 8.1 % — ABNORMAL LOW (ref 14.0–49.0)
MCHC: 33.1 g/dL (ref 32.0–36.0)
MONO#: 0.7 10*3/uL (ref 0.1–0.9)
NEUT#: 5.2 10*3/uL (ref 1.5–6.5)
NEUT%: 78.4 % — ABNORMAL HIGH (ref 39.0–75.0)
Platelets: 266 10*3/uL (ref 140–400)
WBC: 6.6 10*3/uL (ref 4.0–10.3)
lymph#: 0.5 10*3/uL — ABNORMAL LOW (ref 0.9–3.3)

## 2012-08-01 LAB — COMPREHENSIVE METABOLIC PANEL (CC13)
ALT: 12 U/L (ref 0–55)
AST: 19 U/L (ref 5–34)
Albumin: 2.9 g/dL — ABNORMAL LOW (ref 3.5–5.0)
BUN: 14 mg/dL (ref 7.0–26.0)
CO2: 27 mEq/L (ref 22–29)
Calcium: 9.5 mg/dL (ref 8.4–10.4)
Chloride: 102 mEq/L (ref 98–107)
Potassium: 4.8 mEq/L (ref 3.5–5.1)

## 2012-08-01 MED ORDER — SODIUM CHLORIDE 0.9 % IV SOLN
3.0000 mg/kg | Freq: Once | INTRAVENOUS | Status: AC
  Administered 2012-08-01: 190 mg via INTRAVENOUS
  Filled 2012-08-01: qty 38

## 2012-08-01 MED ORDER — SODIUM CHLORIDE 0.9 % IV SOLN
Freq: Once | INTRAVENOUS | Status: AC
  Administered 2012-08-01: 12:00:00 via INTRAVENOUS

## 2012-08-01 MED ORDER — PREDNISONE 20 MG PO TABS: 60.0000 mg | ORAL_TABLET | Freq: Every day | ORAL | Status: DC

## 2012-08-01 NOTE — Patient Instructions (Signed)
[facility] Discharge Instructions for Patients Receiving Treatment  Today you received the following treatment: Yervoy  To help prevent nausea and vomiting after your treatment, we encourage you to take your nausea medication if needed.  For diarrhea, please follow instructions from Dr Gaylyn Rong    If you develop nausea and vomiting that is not controlled by your nausea medication, call the clinic. If it is after clinic hours your family physician or the after hours number for the clinic or go to the Emergency Department.   BELOW ARE SYMPTOMS THAT SHOULD BE REPORTED IMMEDIATELY:  *FEVER GREATER THAN 100.5 F  *CHILLS WITH OR WITHOUT FEVER  NAUSEA AND VOMITING THAT IS NOT CONTROLLED WITH YOUR NAUSEA MEDICATION  *UNUSUAL SHORTNESS OF BREATH  *UNUSUAL BRUISING OR BLEEDING  TENDERNESS IN MOUTH AND THROAT WITH OR WITHOUT PRESENCE OF ULCERS  *URINARY PROBLEMS  *BOWEL PROBLEMS  UNUSUAL RASH Items with * indicate a potential emergency and should be followed up as soon as possible.  One of the nurses will contact you 24 hours after your treatment. Please let the nurse know about any problems that you may have experienced. Feel free to call the clinic you have any questions or concerns. The clinic phone number is [phone].   I have been informed and understand all the instructions given to me. I know to contact the clinic, my physician, or go to the Emergency Department if any problems should occur. I do not have any questions at this time, but understand that I may call the clinic during office hours   should I have any questions or need assistance in obtaining follow up care.    __________________________________________  _____________  __________ Signature of Patient or Authorized Representative            Date                   Time    __________________________________________ Nurse's Signature       Ipilimumab injection What is this medicine? IPILIMUMAB  is used to treat certain types of melanoma. This medicine may be used for other purposes; ask your health care provider or pharmacist if you have questions. What should I tell my health care provider before I take this medicine? They need to know if you have any of these conditions: -Addison's disease -blood in your stools (black or tarry stools) or if you have blood in your vomit -eye disease, vision problems -history of pancreatitis -history of stomach bleeding -immune system problems -inflammatory bowel disease -kidney disease -liver disease -lupus -myasthenia gravis -organ transplant -rheumatoid arthritis -sarcoidosis -stomach or intestine problems -thyroid disease -tingling of the fingers or toes, or other nerve disorder -an unusual or allergic reaction to ipilimumab, other medicines, foods, dyes, or preservatives -pregnant or trying to get pregnant -breast-feeding How should I use this medicine? This medicine is for infusion into a vein. It is given by a health care professional in a hospital or clinic setting. A special MedGuide will be given to you before each treatment. Be sure to read this information carefully each time. Talk to your pediatrician regarding the use of this medicine in children. Special care may be needed. Overdosage: If you think you've taken too much of this medicine contact a poison control center or emergency room at once. Overdosage: If you think you have taken too much of this medicine contact a poison control center or emergency room at once. NOTE: This medicine is only for you. Do not  share this medicine with others. What if I miss a dose? It is important not to miss your dose. Call your doctor or health care professional if you are unable to keep an appointment. What may interact with this medicine? Interactions are not expected. This list may not describe all possible interactions. Give your health care provider a list of all the medicines,  herbs, non-prescription drugs, or dietary supplements you use. Also tell them if you smoke, drink alcohol, or use illegal drugs. Some items may interact with your medicine. What should I watch for while using this medicine? Tell your doctor or healthcare professional if your symptoms do not start to get better or if they get worse. Your condition will be monitored carefully while you are receiving this medicine. You may need blood work done while you are taking this medicine. What side effects may I notice from receiving this medicine? Side effects that you should report to your doctor or health care professional as soon as possible: -allergic reactions like skin rash, itching or hives, swelling of the face, lips, or tongue -black, tarry stools -bloody or watery diarrhea -changes in vision -dark urine -dizziness -eye pain -fast, irregular heartbeat -feeling anxious -feeling faint or lightheaded, falls -general ill feeling or flu-like symptoms -light-colored stools -loss of appetite -nausea, vomiting -pain, tingling, numbness in the hands or feet -redness, blistering, peeling or loosening of the skin, including inside the mouth -right upper belly pain -unusual bleeding or bruising -unusually weak or tired -yellowing of the eyes or skin  Side effects that usually do not require medical attention (Report these to your doctor or health care professional if they continue or are bothersome.): -headache This list may not describe all possible side effects. Call your doctor for medical advice about side effects. You may report side effects to FDA at 1-800-FDA-1088. Where should I keep my medicine? This drug is given in a hospital or clinic and will not be stored at home. NOTE: This sheet is a summary. It may not cover all possible information. If you have questions about this medicine, talk to your doctor, pharmacist, or health care provider.  2013, [employer]. (08/17/2009  10:43:07 AM)

## 2012-08-01 NOTE — Progress Notes (Signed)
[facility]  Telephone:(336) [phone] Fax:(336) [phone]   OFFICE PROGRESS NOTE   Cc:  GATES,ROBERT NEVILL, MD  DIAGNOSIS:  Metastatic melanoma; Braf mutation negative.    PAST THERAPY: biopsy only   CURRENT THERAPY:  Due to start Yervoy today 08/01/2012.   INTERVAL HISTORY: Sean Hunt 79 y.o. male returns for regular follow up with his daughter. He reports feeling relatively well and better than last week. He still has some mild right flank pain; however, it is not as severe as last week. He denies any left flank or chest wall pain. He denies fever, headache, confusion, mucositis, nausea vomiting, GI bleeding, hematuria, skin rash. The rest of the 14 point review of system was negative.  Past Medical History  Diagnosis Date  . Aortic arch aneurysm   . Aortic valve disorder   . HTN (hypertension)   . Hyperlipidemia   . Metastatic melanoma 07/06/2012    to left adrenal gland and left lung; BRAF mutation negative.   . Lung mass 07/06/2012  . Melanoma 08/23/2005    Resected by Dr. Jorja Loa.  Clarks level IV; Breslows 1.78mm; no ulcertaion. Positive margins.  No further info as to whether he had re-excision.   . Atrial fibrillation   . Right inguinal hernia   . Urothelial cancer 07/26/2012    Past Surgical History  Procedure Laterality Date  . Aortic valve surgery    . Aortic arch repair    . Thoracotomy Left 1977    small partial resection, for "clot?"  . Coronary artery bypass graft  2000    x 4  . Inguinal hernia repair Bilateral yrs ago  . Cystoscopy with retrograde pyelogram, ureteroscopy and stent placement Right 07/23/2012    Procedure: CYSTOSCOPY WITH RETROGRADE PYELOGRAM, URETEROSCOPY WITH RESECTIOIN OF RENAL PELVIC TUMOR AND RIGHT STENT PLACEMENT ;  Surgeon: Crecencio Mc, MD;  Location: [facility];  Service: Urology;  Laterality: Right;  . Holmium laser application Right 07/23/2012    Procedure: HOLMIUM LASER APPLICATION OF RIGHT RENAL PELVIC TUMOR;  Surgeon: Crecencio Mc, MD;  Location: [facility];  Service: Urology;  Laterality: Right;    Current Outpatient Prescriptions  Medication Sig Dispense Refill  . aspirin 81 MG tablet Take 81 mg by mouth daily.      . cholecalciferol (VITAMIN D) 1000 UNITS tablet Take 1,000 Units by mouth daily.      . metoprolol (LOPRESSOR) 50 MG tablet Take 25 mg by mouth daily before breakfast. Takes 1/2 tablet      . rosuvastatin (CRESTOR) 20 MG tablet Take 20 mg by mouth daily before breakfast.       . predniSONE (DELTASONE) 20 MG tablet Take 3 tablets (60 mg total) by mouth daily.  100 tablet  0   No current facility-administered medications for this visit.   Facility-Administered Medications Ordered in Other Visits  Medication Dose Route Frequency Provider Last Rate Last Dose  . 0.9 %  sodium chloride infusion   Intravenous Once Exie Parody, MD      . ipilimumab (YERVOY) 190 mg in sodium chloride 0.9 % 100 mL chemo infusion  3 mg/kg (Treatment Plan Actual) Intravenous Once Exie Parody, MD 92 mL/hr at 08/01/12 1202 190 mg at 08/01/12 1202    ALLERGIES:  has No Known Allergies.  REVIEW OF SYSTEMS:  The rest of the 14-point review of system was negative.   Filed Vitals:   08/01/12 0957  BP: 122/42  Pulse: 94  Temp: 98.6 F (  37 C)  Resp: 20   Wt Readings from Last 3 Encounters:  08/01/12 135 lb 12.8 oz (61.598 kg)  07/19/12 137 lb 12.8 oz (62.506 kg)  07/09/12 138 lb 3.2 oz (62.687 kg)   ECOG Performance status:  ECOG 1  PHYSICAL EXAMINATION:  General:  well-nourished in no acute distress.  Eyes:  no scleral icterus.  ENT:  There were no oropharyngeal lesions.  Neck was without thyromegaly.  Lymphatics:  Negative cervical, supraclavicular or axillary adenopathy.  Respiratory: lungs were clear bilaterally without wheezing or crackles.  Cardiovascular:  Regular rate and rhythm, S1/S2, without murmur, rub or gallop.  There was no pedal edema.  GI:  abdomen was soft, flat, nontender, nondistended, without organomegaly.   Muscoloskeletal:  no spinal tenderness of palpation of vertebral spine.  Skin exam was without echymosis, petichae.  Neuro exam was nonfocal.  Patient was able to get on and off exam table without assistance.  Gait was normal.  Patient was alert and oriented.  Attention was good.   Language was appropriate.  Mood was normal without depression.  Speech was not pressured.  Thought content was not tangential.      LABORATORY/RADIOLOGY DATA:  Lab Results  Component Value Date   WBC 6.6 08/01/2012   HGB 11.6* 08/01/2012   HCT 35.0* 08/01/2012   PLT 266 08/01/2012   GLUCOSE 133* 07/19/2012   ALKPHOS 83 07/09/2012   ALT 9 07/09/2012   AST 15 07/09/2012   NA 138 07/19/2012   K 3.9 07/19/2012   CL 100 07/19/2012   CREATININE 0.71 07/19/2012   BUN 11 07/19/2012   CO2 30 07/19/2012   INR 1.05 07/23/2012     ASSESSMENT AND PLAN:    1. Metastatic melanoma with disease to the left lung and left adrenal gland; biopsy proven; Braf negative.  - Treatment recommendation:  Immunotherapy Yervoy IV every 3 weeks x 4 dose.  -  Potential side effects:  Autoimmune:  Skin rash, diarrhea, abnormal liver function test, low thyroid function, rare chance of GI perforation. -  Prescription for Prednisone 60 mg by mouth daily is for when one of the autoimmune side effects occurs such as diarrhea, skin rash.  I advised him to contact 911 immediately if he has GI bleeding or severe abdominal pain.  2.  Right renal pelvic tumor: s/p resection by Dr. Laverle Patter.  Path pending.   3. Afib: He is on Coumadin and rate control with metoprolol.  In the future, if he has bleeding complication for metastatic cancer or its treatment, I will need to discuss with his cardiologist about what to do with his Coumadin.  4.  Code status:  DO NOT RESUSCITATE/DO NOT INTUBATE as previously discussed.   5.  followup: In about 7-10 days for midcycle visit. I will see him myself in about 3 weeks he for the second cycle of chemotherapy.   The length of  time of the face-to-face encounter was15 minutes. More than 50% of time was spent counseling and coordination of care.          Allayah Raineri T. Gaylyn Rong, M.D.

## 2012-08-01 NOTE — Patient Instructions (Addendum)
1.  Metastatic melanoma. 2.  Treatment recommendation:  Immunotherapy Yervoy IV every 3 weeks x 4 dose.  3.  Potential side effects:  Autoimmune:  Skin rash, diarrhea, abnormal liver function test, low thyroid function.  4.  Prescription for Prednisone is for when one of the autoimmune side effects occurs.

## 2012-08-02 ENCOUNTER — Telehealth: Payer: Self-pay | Admitting: *Deleted

## 2012-08-02 NOTE — Telephone Encounter (Signed)
Message copied by Kathlynn Grate on Thu Aug 02, 2012  3:58 PM ------      Message from: Caren Griffins      Created: Wed Aug 01, 2012  1:37 PM      Regarding: chemo f/u       1st time yervoy, [phone].  Dr Gaylyn Rong ------

## 2012-08-02 NOTE — Telephone Encounter (Signed)
Patient call for first time Yervoy. Denies any fever, chills, nausea, vomiting or diarrhea. Patient only complaint is loss of appetite and fatigue. He is hoping this will continue to improve with time. Instructed patient to call immediately if sudden onset of diarrhea.

## 2012-08-10 ENCOUNTER — Telehealth: Payer: Self-pay | Admitting: Dietician

## 2012-08-15 ENCOUNTER — Encounter: Payer: Self-pay | Admitting: Oncology

## 2012-08-15 ENCOUNTER — Other Ambulatory Visit (HOSPITAL_BASED_OUTPATIENT_CLINIC_OR_DEPARTMENT_OTHER): Payer: Medicare Other | Admitting: Lab

## 2012-08-15 ENCOUNTER — Ambulatory Visit (HOSPITAL_BASED_OUTPATIENT_CLINIC_OR_DEPARTMENT_OTHER): Payer: Medicare Other | Admitting: Oncology

## 2012-08-15 VITALS — BP 111/60 | HR 94 | Temp 96.6°F | Resp 20 | Ht 70.0 in | Wt 133.6 lb

## 2012-08-15 DIAGNOSIS — C801 Malignant (primary) neoplasm, unspecified: Secondary | ICD-10-CM

## 2012-08-15 DIAGNOSIS — C799 Secondary malignant neoplasm of unspecified site: Secondary | ICD-10-CM

## 2012-08-15 DIAGNOSIS — C439 Malignant melanoma of skin, unspecified: Secondary | ICD-10-CM

## 2012-08-15 LAB — COMPREHENSIVE METABOLIC PANEL (CC13)
ALT: 6 U/L (ref 0–55)
AST: 15 U/L (ref 5–34)
Albumin: 3.1 g/dL — ABNORMAL LOW (ref 3.5–5.0)
CO2: 30 mEq/L — ABNORMAL HIGH (ref 22–29)
Calcium: 9.6 mg/dL (ref 8.4–10.4)
Chloride: 103 mEq/L (ref 98–107)
Potassium: 4 mEq/L (ref 3.5–5.1)
Total Protein: 7.1 g/dL (ref 6.4–8.3)

## 2012-08-15 LAB — CBC WITH DIFFERENTIAL/PLATELET
BASO%: 0.6 % (ref 0.0–2.0)
EOS%: 2.1 % (ref 0.0–7.0)
HGB: 12.8 g/dL — ABNORMAL LOW (ref 13.0–17.1)
MCH: 29.5 pg (ref 27.2–33.4)
MCHC: 33.5 g/dL (ref 32.0–36.0)
MONO#: 0.5 10*3/uL (ref 0.1–0.9)
RDW: 13.8 % (ref 11.0–14.6)
WBC: 5.9 10*3/uL (ref 4.0–10.3)
lymph#: 0.6 10*3/uL — ABNORMAL LOW (ref 0.9–3.3)

## 2012-08-15 NOTE — Progress Notes (Signed)
[facility]  Telephone:(336) [phone] Fax:(336) [phone]   OFFICE PROGRESS NOTE   Cc:  GATES,ROBERT NEVILL, MD  DIAGNOSIS:  Metastatic melanoma; Braf mutation negative.    PAST THERAPY: biopsy only   CURRENT THERAPY:  [name] on 08/01/2012.   INTERVAL HISTORY: Sean Hunt 79 y.o. male returns for regular follow up with his grandson. He tolerated the first dose of Yervoy well. He has mild fatigue but is still able to get out of the house and go grocery shopping. He would like to do some yard work as well. Right flank pain has resolved; not taking any pain medications. He denies any left flank or chest wall pain. He denies fever, headache, confusion, mucositis, nausea vomiting, GI bleeding, hematuria, skin rash. No diarrhea. No neuropathy. The rest of the 14 point review of system was negative.  Past Medical History  Diagnosis Date  . Aortic arch aneurysm   . Aortic valve disorder   . HTN (hypertension)   . Hyperlipidemia   . Metastatic melanoma 07/06/2012    to left adrenal gland and left lung; BRAF mutation negative.   . Lung mass 07/06/2012  . Melanoma 08/23/2005    Resected by Dr. Jorja Loa.  Clarks level IV; Breslows 1.41mm; no ulcertaion. Positive margins.  No further info as to whether he had re-excision.   . Atrial fibrillation   . Right inguinal hernia   . Urothelial cancer 07/26/2012    Past Surgical History  Procedure Laterality Date  . Aortic valve surgery    . Aortic arch repair    . Thoracotomy Left 1977    small partial resection, for "clot?"  . Coronary artery bypass graft  2000    x 4  . Inguinal hernia repair Bilateral yrs ago  . Cystoscopy with retrograde pyelogram, ureteroscopy and stent placement Right 07/23/2012    Procedure: CYSTOSCOPY WITH RETROGRADE PYELOGRAM, URETEROSCOPY WITH RESECTIOIN OF RENAL PELVIC TUMOR AND RIGHT STENT PLACEMENT ;  Surgeon: Crecencio Mc, MD;  Location: [facility];  Service: Urology;  Laterality: Right;  . Holmium  laser application Right 07/23/2012    Procedure: HOLMIUM LASER APPLICATION OF RIGHT RENAL PELVIC TUMOR;  Surgeon: Crecencio Mc, MD;  Location: [facility];  Service: Urology;  Laterality: Right;    Current Outpatient Prescriptions  Medication Sig Dispense Refill  . warfarin (COUMADIN) 5 MG tablet Take 7.5 mg by mouth daily.      Marland Kitchen aspirin 81 MG tablet Take 81 mg by mouth daily.      . cholecalciferol (VITAMIN D) 1000 UNITS tablet Take 1,000 Units by mouth daily.      . metoprolol (LOPRESSOR) 50 MG tablet Take 25 mg by mouth daily before breakfast. Takes 1/2 tablet      . predniSONE (DELTASONE) 20 MG tablet Take 3 tablets (60 mg total) by mouth daily.  100 tablet  0  . rosuvastatin (CRESTOR) 20 MG tablet Take 20 mg by mouth daily before breakfast.        No current facility-administered medications for this visit.    ALLERGIES:  has No Known Allergies.  REVIEW OF SYSTEMS:  The rest of the 14-point review of system was negative.   Filed Vitals:   08/15/12 0943  BP: 111/60  Pulse: 94  Temp: 96.6 F (35.9 C)  Resp: 20   Wt Readings from Last 3 Encounters:  08/15/12 133 lb 9.6 oz (60.601 kg)  08/01/12 135 lb 12.8 oz (61.598 kg)  07/19/12 137 lb 12.8 oz (62.506 kg)  ECOG Performance status:  ECOG 1  PHYSICAL EXAMINATION:  General:  well-nourished in no acute distress.  Eyes:  no scleral icterus.  ENT:  There were no oropharyngeal lesions.  Neck was without thyromegaly.  Lymphatics:  Negative cervical, supraclavicular or axillary adenopathy.  Respiratory: lungs were clear bilaterally without wheezing or crackles.  Cardiovascular:  Regular rate and rhythm, S1/S2, without murmur, rub or gallop.  There was no pedal edema.  GI:  abdomen was soft, flat, nontender, nondistended, without organomegaly.  Muscoloskeletal:  no spinal tenderness of palpation of vertebral spine.  Skin exam was without echymosis, petichae.  Neuro exam was nonfocal.  Patient was able to get on and off exam table without  assistance.  Gait was normal.  Patient was alert and oriented.  Attention was good.   Language was appropriate.  Mood was normal without depression.  Speech was not pressured.  Thought content was not tangential.      LABORATORY/RADIOLOGY DATA:  Lab Results  Component Value Date   WBC 5.9 08/15/2012   HGB 12.8* 08/15/2012   HCT 38.2* 08/15/2012   PLT 181 08/15/2012   GLUCOSE 118* 08/15/2012   ALKPHOS 93 08/15/2012   ALT 6 08/15/2012   AST 15 08/15/2012   NA 141 08/15/2012   K 4.0 08/15/2012   CL 103 08/15/2012   CREATININE 0.8 08/15/2012   BUN 10.1 08/15/2012   CO2 30* 08/15/2012   INR 1.05 07/23/2012     ASSESSMENT AND PLAN:    1. Metastatic melanoma with disease to the left lung and left adrenal gland; biopsy proven; Braf negative.  - Treatment recommendation:  Immunotherapy Yervoy IV every 3 weeks x 4 dose.  -  He is s/p 1 cycle of Yervoy. He has grade 1 fatigue. -  Prescription for Prednisone 60 mg by mouth daily is for when one of the autoimmune side effects occurs such as diarrhea, skin rash.  I advised him to contact 911 immediately if he has GI bleeding or severe abdominal pain.  2.  Right renal pelvic tumor: s/p resection by Dr. Laverle Patter.  Path pending.   3. Afib: He is on Coumadin and rate control with metoprolol.  In the future, if he has bleeding complication for metastatic cancer or its treatment, I will need to discuss with his cardiologist about what to do with his Coumadin.  4.  Code status:  DO NOT RESUSCITATE/DO NOT INTUBATE as previously discussed.   5.  followup: On 5/7 prior to cycle 2 of Yervoy.   The length of time of the face-to-face encounter was15 minutes. More than 50% of time was spent counseling and coordination of care.          Huan T. Gaylyn Rong, M.D.

## 2012-08-21 NOTE — Patient Instructions (Addendum)
1.  Diagnosis:  Metastatic melanoma. 2.  Treatment:  Continue cycle #2 of Yervoy injection. 3.  Watch out for autoimmune side effects such as skin rash, diarrhea, jaundice, severe fatigue.  We may need to start Prednisone steroid to counter these side effects.

## 2012-08-22 ENCOUNTER — Other Ambulatory Visit (HOSPITAL_BASED_OUTPATIENT_CLINIC_OR_DEPARTMENT_OTHER): Payer: Medicare Other | Admitting: Lab

## 2012-08-22 ENCOUNTER — Ambulatory Visit (HOSPITAL_BASED_OUTPATIENT_CLINIC_OR_DEPARTMENT_OTHER): Payer: Medicare Other

## 2012-08-22 ENCOUNTER — Telehealth: Payer: Self-pay | Admitting: Oncology

## 2012-08-22 ENCOUNTER — Ambulatory Visit: Payer: Medicare Other | Admitting: Oncology

## 2012-08-22 ENCOUNTER — Other Ambulatory Visit: Payer: Medicare Other | Admitting: Lab

## 2012-08-22 ENCOUNTER — Ambulatory Visit (HOSPITAL_BASED_OUTPATIENT_CLINIC_OR_DEPARTMENT_OTHER): Payer: Medicare Other | Admitting: Oncology

## 2012-08-22 VITALS — BP 118/62 | HR 83 | Temp 97.6°F | Resp 18 | Ht 70.0 in | Wt 135.1 lb

## 2012-08-22 DIAGNOSIS — I4891 Unspecified atrial fibrillation: Secondary | ICD-10-CM

## 2012-08-22 DIAGNOSIS — C7800 Secondary malignant neoplasm of unspecified lung: Secondary | ICD-10-CM

## 2012-08-22 DIAGNOSIS — Z5112 Encounter for antineoplastic immunotherapy: Secondary | ICD-10-CM

## 2012-08-22 DIAGNOSIS — C7970 Secondary malignant neoplasm of unspecified adrenal gland: Secondary | ICD-10-CM

## 2012-08-22 DIAGNOSIS — C439 Malignant melanoma of skin, unspecified: Secondary | ICD-10-CM

## 2012-08-22 DIAGNOSIS — C799 Secondary malignant neoplasm of unspecified site: Secondary | ICD-10-CM

## 2012-08-22 DIAGNOSIS — R5383 Other fatigue: Secondary | ICD-10-CM

## 2012-08-22 LAB — CBC WITH DIFFERENTIAL/PLATELET
BASO%: 0.3 % (ref 0.0–2.0)
EOS%: 2.8 % (ref 0.0–7.0)
HCT: 34.8 % — ABNORMAL LOW (ref 38.4–49.9)
MCH: 29.4 pg (ref 27.2–33.4)
MCHC: 33.3 g/dL (ref 32.0–36.0)
NEUT%: 71.1 % (ref 39.0–75.0)
RBC: 3.95 10*6/uL — ABNORMAL LOW (ref 4.20–5.82)
RDW: 13.9 % (ref 11.0–14.6)
lymph#: 1 10*3/uL (ref 0.9–3.3)

## 2012-08-22 LAB — COMPREHENSIVE METABOLIC PANEL (CC13)
ALT: 6 U/L (ref 0–55)
AST: 16 U/L (ref 5–34)
Calcium: 9.6 mg/dL (ref 8.4–10.4)
Chloride: 103 mEq/L (ref 98–107)
Creatinine: 0.7 mg/dL (ref 0.7–1.3)
Sodium: 140 mEq/L (ref 136–145)

## 2012-08-22 MED ORDER — SODIUM CHLORIDE 0.9 % IV SOLN
Freq: Once | INTRAVENOUS | Status: AC
  Administered 2012-08-22: 15:00:00 via INTRAVENOUS

## 2012-08-22 MED ORDER — SODIUM CHLORIDE 0.9 % IV SOLN
3.0000 mg/kg | Freq: Once | INTRAVENOUS | Status: AC
  Administered 2012-08-22: 190 mg via INTRAVENOUS
  Filled 2012-08-22: qty 38

## 2012-08-22 NOTE — Patient Instructions (Addendum)
[facility] Discharge Instructions for Patients Receiving Chemotherapy  Today you received the following chemotherapy agents : Yervoy.   BELOW ARE SYMPTOMS THAT SHOULD BE REPORTED IMMEDIATELY:  *FEVER GREATER THAN 100.5 F  *CHILLS WITH OR WITHOUT FEVER  NAUSEA AND VOMITING THAT IS NOT CONTROLLED WITH YOUR NAUSEA MEDICATION  *UNUSUAL SHORTNESS OF BREATH  *UNUSUAL BRUISING OR BLEEDING  TENDERNESS IN MOUTH AND THROAT WITH OR WITHOUT PRESENCE OF ULCERS  *URINARY PROBLEMS  *BOWEL PROBLEMS  UNUSUAL RASH Items with * indicate a potential emergency and should be followed up as soon as possible.  Feel free to call the clinic you have any questions or concerns. The clinic phone number is [phone].   I have been informed and understand all the instructions given to me. I know to contact the clinic, my physician, or go to the Emergency Department if any problems should occur. I do not have any questions at this time, but understand that I may call the clinic during office hours   should I have any questions or need assistance in obtaining follow up care.    __________________________________________  _____________  __________ Signature of Patient or Authorized Representative            Date                   Time    __________________________________________ Nurse's Signature

## 2012-08-22 NOTE — Progress Notes (Signed)
[name]  Telephone:(336) [phone] Fax:(336) [phone]   OFFICE PROGRESS NOTE   Cc:  Hunt,Sean NEVILL, MD  DIAGNOSIS:  Metastatic melanoma; Braf mutation negative.    PAST THERAPY: biopsy only   CURRENT THERAPY:  Due to start Yervoy on 08/01/2012.   INTERVAL HISTORY: Sean Hunt 78 y.o. male returns for regular follow up with his son.  He started Yervoy 3 weeks ago without problem.  He denied fever, skin rash, diarrhea.  He has mild fatigue, but he is still independent of activities of daily living.  He denied hematuria, or other source of bleeding.  The rest of the 14-point review of system was negative.   Past Medical History  Diagnosis Date  . Aortic arch aneurysm   . Aortic valve disorder   . HTN (hypertension)   . Hyperlipidemia   . Metastatic melanoma 07/06/2012    to left adrenal gland and left lung; BRAF mutation negative.   . Lung mass 07/06/2012  . Melanoma 08/23/2005    Resected by Dr. Jorja Hunt.  Clarks level IV; Breslows 1.22mm; no ulcertaion. Positive margins.  No further info as to whether he had re-excision.   . Atrial fibrillation   . Right inguinal hernia   . Urothelial cancer 07/26/2012    Past Surgical History  Procedure Laterality Date  . Aortic valve surgery    . Aortic arch repair    . Thoracotomy Left 1977    small partial resection, for "clot?"  . Coronary artery bypass graft  2000    x 4  . Inguinal hernia repair Bilateral yrs ago  . Cystoscopy with retrograde pyelogram, ureteroscopy and stent placement Right 07/23/2012    Procedure: CYSTOSCOPY WITH RETROGRADE PYELOGRAM, URETEROSCOPY WITH RESECTIOIN OF RENAL PELVIC TUMOR AND RIGHT STENT PLACEMENT ;  Surgeon: Sean Mc, MD;  Location: [facility];  Service: Urology;  Laterality: Right;  . Holmium laser application Right 07/23/2012    Procedure: HOLMIUM LASER APPLICATION OF RIGHT RENAL PELVIC TUMOR;  Surgeon: Sean Mc, MD;  Location: [facility];  Service: Urology;  Laterality: Right;     Current Outpatient Prescriptions  Medication Sig Dispense Refill  . aspirin 81 MG tablet Take 81 mg by mouth daily.      . cholecalciferol (VITAMIN D) 1000 UNITS tablet Take 1,000 Units by mouth daily.      . metoprolol (LOPRESSOR) 50 MG tablet Take 25 mg by mouth daily before breakfast. Takes 1/2 tablet      . predniSONE (DELTASONE) 20 MG tablet Take 3 tablets (60 mg total) by mouth daily.  100 tablet  0  . rosuvastatin (CRESTOR) 20 MG tablet Take 20 mg by mouth daily before breakfast.       . warfarin (COUMADIN) 5 MG tablet Take 7.5 mg by mouth daily.       No current facility-administered medications for this visit.    ALLERGIES:  has No Known Allergies.  REVIEW OF SYSTEMS:  The rest of the 14-point review of system was negative.   Filed Vitals:   08/22/12 1359  BP: 118/62  Pulse: 83  Temp: 97.6 F (36.4 C)  Resp: 18   Wt Readings from Last 3 Encounters:  08/22/12 135 lb 1.6 oz (61.281 kg)  08/15/12 133 lb 9.6 oz (60.601 kg)  08/01/12 135 lb 12.8 oz (61.598 kg)   ECOG Performance status:  ECOG 1  PHYSICAL EXAMINATION:  General:  well-nourished man, in no acute distress.  Eyes:  no scleral icterus.  ENT:  There were no oropharyngeal lesions.  Neck was without thyromegaly.  Lymphatics:  Negative cervical, supraclavicular or axillary adenopathy.  Respiratory: lungs were clear bilaterally without wheezing or crackles.  Cardiovascular:  Regular rate and rhythm, S1/S2, without murmur, rub or gallop.  There was no pedal edema.  GI:  abdomen was soft, flat, nontender, nondistended, without organomegaly.  Muscoloskeletal:  no spinal tenderness of palpation of vertebral spine.  Skin exam was without echymosis, petichae.  Neuro exam was nonfocal.  Patient was able to get on and off exam table without assistance.  Gait was normal.  Patient was alert and oriented.  Attention was good.   Language was appropriate.  Mood was normal without depression.  Speech was not pressured.  Thought  content was not tangential.      LABORATORY/RADIOLOGY DATA:  Lab Results  Component Value Date   WBC 6.8 08/22/2012   HGB 11.6* 08/22/2012   HCT 34.8* 08/22/2012   PLT 183 08/22/2012   GLUCOSE 110* 08/22/2012   ALKPHOS 98 08/22/2012   ALT 6 08/22/2012   AST 16 08/22/2012   NA 140 08/22/2012   K 3.9 08/22/2012   CL 103 08/22/2012   CREATININE 0.7 08/22/2012   BUN 13.3 08/22/2012   CO2 28 08/22/2012   INR 1.05 07/23/2012     ASSESSMENT AND PLAN:    1. Metastatic melanoma with disease to the left lung and left adrenal gland; biopsy proven; Braf negative.  He is tolerating Yervoy without problem.  I advised him to proceed with the 2nd cycle of Yervoy without modifications.   2.  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 Laverle Hunt. His   3. Afib and history of of valve replacement: He is on Coumadin and rate control with metoprolol.  His INR has been sub therapeutic per his report.  I advised him to talk with his Coumadin clinic at his cardiologist's office for advise on titration of Coumadin.   4.  Code status:  DO NOT RESUSCITATE/DO NOT INTUBATE as previously discussed.   5.  followup: in 3 weeks before cycle #3 of Yervoy.       Sean Hunt T. Gaylyn Rong, M.D.

## 2012-08-22 NOTE — Telephone Encounter (Signed)
gv and printed appt sched for this pt...eamield MB to add tx.Marland KitchenMarland Kitchen

## 2012-08-23 ENCOUNTER — Other Ambulatory Visit: Payer: Medicare Other | Admitting: Lab

## 2012-08-23 ENCOUNTER — Ambulatory Visit: Payer: Medicare Other | Admitting: Oncology

## 2012-08-29 ENCOUNTER — Telehealth: Payer: Self-pay | Admitting: *Deleted

## 2012-08-29 DIAGNOSIS — C799 Secondary malignant neoplasm of unspecified site: Secondary | ICD-10-CM

## 2012-08-29 NOTE — Telephone Encounter (Signed)
Please advise him to start Prednisone 60mg  PO daily; See Belenda Cruise on Friday 08/31/12 with CBC and CMET.  Thanks. Continue Imodium after each diarrheal stool.

## 2012-08-29 NOTE — Telephone Encounter (Signed)
Pt reports Diarrhea,  Small loose stools occasionally mixed w/ some blood since Saturday night.  He s/w a On Call MD who instructed him to take imodium,  But the imodium is not helping.  He only took 3 imodium yesterday.  He had about 5 episodes of diarrhea yesterday and two this morning.   He has Prednisone in home which he has not been instructed to take yet.

## 2012-08-29 NOTE — Telephone Encounter (Signed)
Instructed pt to start taking Prednisone 60 mg daily,  Starting now and then every morning until directed to stop.  Instructed to take 3 of the 20 mg tabs to equal 60 mg and to take w/ food.  He may also continue to take imodium as needed after each diarrhea stool,  Up to max 8 daily.  Come in for appt on Friday 5/16 at 11:15 am for lab and to see Sean Hunt at 11:45 am.  Call back if symptoms worsen or do not improve.   He verbalized understanding.

## 2012-08-30 ENCOUNTER — Telehealth: Payer: Self-pay | Admitting: *Deleted

## 2012-08-30 NOTE — Telephone Encounter (Signed)
sw pt gv appt d/t for 08/31/12. Pt know to be here by 11:15am for labs then ov to follow...td

## 2012-08-31 ENCOUNTER — Telehealth: Payer: Self-pay | Admitting: Oncology

## 2012-08-31 ENCOUNTER — Encounter: Payer: Self-pay | Admitting: Oncology

## 2012-08-31 ENCOUNTER — Other Ambulatory Visit (HOSPITAL_BASED_OUTPATIENT_CLINIC_OR_DEPARTMENT_OTHER): Payer: Medicare Other | Admitting: Lab

## 2012-08-31 ENCOUNTER — Ambulatory Visit (HOSPITAL_BASED_OUTPATIENT_CLINIC_OR_DEPARTMENT_OTHER): Payer: Medicare Other | Admitting: Oncology

## 2012-08-31 VITALS — BP 127/52 | HR 87 | Temp 97.0°F | Resp 17 | Ht 70.0 in | Wt 133.4 lb

## 2012-08-31 DIAGNOSIS — C801 Malignant (primary) neoplasm, unspecified: Secondary | ICD-10-CM

## 2012-08-31 DIAGNOSIS — C799 Secondary malignant neoplasm of unspecified site: Secondary | ICD-10-CM

## 2012-08-31 DIAGNOSIS — C439 Malignant melanoma of skin, unspecified: Secondary | ICD-10-CM

## 2012-08-31 LAB — COMPREHENSIVE METABOLIC PANEL (CC13)
BUN: 18.5 mg/dL (ref 7.0–26.0)
CO2: 26 mEq/L (ref 22–29)
Calcium: 9.2 mg/dL (ref 8.4–10.4)
Chloride: 104 mEq/L (ref 98–107)
Creatinine: 0.8 mg/dL (ref 0.7–1.3)
Total Bilirubin: 0.34 mg/dL (ref 0.20–1.20)

## 2012-08-31 LAB — CBC WITH DIFFERENTIAL/PLATELET
Basophils Absolute: 0 10*3/uL (ref 0.0–0.1)
EOS%: 0.3 % (ref 0.0–7.0)
HCT: 33.9 % — ABNORMAL LOW (ref 38.4–49.9)
HGB: 11.6 g/dL — ABNORMAL LOW (ref 13.0–17.1)
LYMPH%: 4.9 % — ABNORMAL LOW (ref 14.0–49.0)
MCH: 29.7 pg (ref 27.2–33.4)
NEUT%: 86.6 % — ABNORMAL HIGH (ref 39.0–75.0)
Platelets: 240 10*3/uL (ref 140–400)
lymph#: 0.4 10*3/uL — ABNORMAL LOW (ref 0.9–3.3)

## 2012-08-31 MED ORDER — DIPHENOXYLATE-ATROPINE 2.5-0.025 MG PO TABS: 2.0000 | ORAL_TABLET | Freq: Four times a day (QID) | ORAL | Status: DC | PRN

## 2012-08-31 NOTE — Progress Notes (Signed)
[facility]  Telephone:(336) [phone] Fax:(336) [phone]   OFFICE PROGRESS NOTE   Cc:  GATES,ROBERT NEVILL, MD  DIAGNOSIS:  Metastatic melanoma; Braf mutation negative.    PAST THERAPY: biopsy only   CURRENT THERAPY:  [name] on 08/01/2012.   INTERVAL HISTORY: Sean Hunt 79 y.o. male returns for a work in appointment by himself.  Received his second cycle of Yervoy about 10 days ago. The patient developed diarrhea this past weekend and was told to take Imodium. He called our office earlier this week and told us that the Imodium was not working. He was instructed take prednisone 60 mg daily starting on 08/29/2012. He is continued take one Imodium a day. He continues to have loose stools. States that he sees mucus and a small amount of blood. He had difficulty quantify how these loose stools he is having per day, but thinks he is having 8-10 loose stools per day. He has mild fatigue. No chest pain, shortness of breath, dyspnea. No bowel pain, nausea, vomiting. No rashes. No fevers.  He remains independent in activities of daily living. He has been trying to eat and he is drinking a lot of water. No headaches or dizziness. The rest of the 14-point review of system was negative.   Past Medical History  Diagnosis Date  . Aortic arch aneurysm   . Aortic valve disorder   . HTN (hypertension)   . Hyperlipidemia   . Metastatic melanoma 07/06/2012    to left adrenal gland and left lung; BRAF mutation negative.   . Lung mass 07/06/2012  . Melanoma 08/23/2005    Resected by Dr. Jorja Loa.  Clarks level IV; Breslows 1.14mm; no ulcertaion. Positive margins.  No further info as to whether he had re-excision.   . Atrial fibrillation   . Right inguinal hernia   . Urothelial cancer 07/26/2012    Past Surgical History  Procedure Laterality Date  . Aortic valve surgery    . Aortic arch repair    . Thoracotomy Left 1977    small partial resection, for "clot?"  . Coronary  artery bypass graft  2000    x 4  . Inguinal hernia repair Bilateral yrs ago  . Cystoscopy with retrograde pyelogram, ureteroscopy and stent placement Right 07/23/2012    Procedure: CYSTOSCOPY WITH RETROGRADE PYELOGRAM, URETEROSCOPY WITH RESECTIOIN OF RENAL PELVIC TUMOR AND RIGHT STENT PLACEMENT ;  Surgeon: Crecencio Mc, MD;  Location: [facility];  Service: Urology;  Laterality: Right;  . Holmium laser application Right 07/23/2012    Procedure: HOLMIUM LASER APPLICATION OF RIGHT RENAL PELVIC TUMOR;  Surgeon: Crecencio Mc, MD;  Location: [facility];  Service: Urology;  Laterality: Right;    Current Outpatient Prescriptions  Medication Sig Dispense Refill  . aspirin 81 MG tablet Take 81 mg by mouth daily.      . cholecalciferol (VITAMIN D) 1000 UNITS tablet Take 1,000 Units by mouth daily.      . diphenoxylate-atropine (LOMOTIL) 2.5-0.025 MG per tablet Take 2 tablets by mouth 4 (four) times daily as needed for diarrhea or loose stools.  60 tablet  1  . metoprolol (LOPRESSOR) 50 MG tablet Take 25 mg by mouth daily before breakfast. Takes 1/2 tablet      . predniSONE (DELTASONE) 20 MG tablet Take 3 tablets (60 mg total) by mouth daily.  100 tablet  0  . rosuvastatin (CRESTOR) 20 MG tablet Take 20 mg by mouth daily before breakfast.       .  warfarin (COUMADIN) 5 MG tablet Take 7.5 mg by mouth daily.       No current facility-administered medications for this visit.    ALLERGIES:  has No Known Allergies.  REVIEW OF SYSTEMS:  The rest of the 14-point review of system was negative.   Filed Vitals:   08/31/12 1124  BP: 127/52  Pulse: 87  Temp: 97 F (36.1 C)  Resp: 17   Wt Readings from Last 3 Encounters:  08/31/12 133 lb 6.4 oz (60.51 kg)  08/22/12 135 lb 1.6 oz (61.281 kg)  08/15/12 133 lb 9.6 oz (60.601 kg)   ECOG Performance status:  ECOG 1  PHYSICAL EXAMINATION:  General:  well-nourished man, in no acute distress.  Eyes:  no scleral icterus.  ENT:  There were no oropharyngeal lesions.  Neck  was without thyromegaly.  Lymphatics:  Negative cervical, supraclavicular or axillary adenopathy.  Respiratory: lungs were clear bilaterally without wheezing or crackles.  Cardiovascular:  Regular rate and rhythm, S1/S2, without murmur, rub or gallop.  There was no pedal edema.  GI:  abdomen was soft, flat, nontender, nondistended, without organomegaly.  Muscoloskeletal:  no spinal tenderness of palpation of vertebral spine.  Skin exam was without echymosis, petichae.  Neuro exam was nonfocal.  Patient was able to get on and off exam table without assistance.  Gait was normal.  Patient was alert and oriented.  Attention was good.   Language was appropriate.  Mood was normal without depression.  Speech was not pressured.  Thought content was not tangential.     LABORATORY/RADIOLOGY DATA:  Lab Results  Component Value Date   WBC 7.7 08/31/2012   HGB 11.6* 08/31/2012   HCT 33.9* 08/31/2012   PLT 240 08/31/2012   GLUCOSE 126* 08/31/2012   ALKPHOS 77 08/31/2012   ALT 7 08/31/2012   AST 13 08/31/2012   NA 139 08/31/2012   K 4.2 08/31/2012   CL 104 08/31/2012   CREATININE 0.8 08/31/2012   BUN 18.5 08/31/2012   CO2 26 08/31/2012   INR 1.05 07/23/2012     ASSESSMENT AND PLAN:    1. Metastatic melanoma with disease to the left lung and left adrenal gland; biopsy proven; Braf negative. - Status post 2 doses of Yervoy. Now with grade 3 diarrhea secondary to the [facility]. Recommend that he continue prednisone 60 mg daily. He will take Imodium up to 8 tablets per day. If his diarrhea is not better tomorrow he will begin Lomotil and alternate this with the Imodium. I have asked him to stop all dairy products. I've also encouraged him to begin a probiotic. If his diarrhea has not resolved by early next week, then he may need to be admitted to the hospital for IV steroids. I will call him early next week to check in on him.  2.  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    3. Afib  and history of of valve replacement: He is on Coumadin and rate control with metoprolol.  His INR has been sub therapeutic per his report.  I advised him to talk with his Coumadin clinic at his cardiologist's office for advise on titration of Coumadin.   4.  Code status:  DO NOT RESUSCITATE/DO NOT INTUBATE as previously discussed.   5.  followup: I will see him again next week to be sure he is doing well.   Case reviewed with Dr Gaylyn Rong.

## 2012-08-31 NOTE — Patient Instructions (Addendum)
Use Imodium 1 tab after each loose stool up to 8 tabs per day. If still having diarrhea tomorrow, then alternate Imodium and lomotil (see new prescription) Continue Prednisone 60 mg daily Avoid all dairy products You may use a probiotic tablet daily (over the counter)

## 2012-08-31 NOTE — Telephone Encounter (Signed)
gv and printed appt sched and avs for pt...pt did not want early appt on 5.22 due to going out of town.Marland KitchenMarland Kitchen

## 2012-09-03 ENCOUNTER — Telehealth: Payer: Self-pay | Admitting: *Deleted

## 2012-09-03 NOTE — Telephone Encounter (Signed)
Message copied by Wende Mott on Mon Sep 03, 2012 12:07 PM ------      Message from: Clenton Pare R      Created: Mon Sep 03, 2012 11:55 AM      Regarding: Please call pt       Please call pt to f/u on diarrhea. Find out if he is still having diarrhea, How many times per day? Any blood or mucus? Is he taking Imodium and Lomotil? Once I know, we may need to admit if he is still having diarrhea.            Thanks ------

## 2012-09-03 NOTE — Telephone Encounter (Signed)
Pt states he had 1 to 2 diarrhea stools yesterday during day and about 3 last night. This is w/ taking 6 lomotil total per day.  Not taking any imodium.  Continues to take prednisone 60 mg daily as instructed.  Has a little blood and mucous in stools but "better" and "clearing up a lot."  He has not had BM since early this morning and took 2 lomotil this morning.  States he is able to drink plenty of fluids and also V8 juice as recommended.

## 2012-09-03 NOTE — Telephone Encounter (Signed)
Ok - sounds like he is improving. Tell him to continue the Lomotil, but alternate it with Imodium. Also, instruct him to keep track of the number of stools he is having so we can tell if it is improving. He is due to see me later this week.

## 2012-09-03 NOTE — Telephone Encounter (Signed)
Relayed Kristin's message below to pt.  Instructed him to alternate the imodium w/ the lomotil,  Keep track of number of stools and encourage plenty of PO fluids. Continue prednisone as directed.  Keep appt on 5/21 as scheduled but call if his diarrhea worsens or sees more blood in stool.  He verbalized understanding.

## 2012-09-05 ENCOUNTER — Ambulatory Visit (HOSPITAL_BASED_OUTPATIENT_CLINIC_OR_DEPARTMENT_OTHER): Payer: Medicare Other | Admitting: Oncology

## 2012-09-05 ENCOUNTER — Encounter: Payer: Self-pay | Admitting: Oncology

## 2012-09-05 ENCOUNTER — Other Ambulatory Visit (HOSPITAL_BASED_OUTPATIENT_CLINIC_OR_DEPARTMENT_OTHER): Payer: Medicare Other | Admitting: Lab

## 2012-09-05 VITALS — BP 122/59 | HR 72 | Temp 97.6°F | Resp 17 | Ht 70.0 in | Wt 131.8 lb

## 2012-09-05 DIAGNOSIS — C439 Malignant melanoma of skin, unspecified: Secondary | ICD-10-CM

## 2012-09-05 DIAGNOSIS — C659 Malignant neoplasm of unspecified renal pelvis: Secondary | ICD-10-CM

## 2012-09-05 DIAGNOSIS — C799 Secondary malignant neoplasm of unspecified site: Secondary | ICD-10-CM

## 2012-09-05 DIAGNOSIS — C7800 Secondary malignant neoplasm of unspecified lung: Secondary | ICD-10-CM

## 2012-09-05 DIAGNOSIS — C7970 Secondary malignant neoplasm of unspecified adrenal gland: Secondary | ICD-10-CM

## 2012-09-05 LAB — CBC WITH DIFFERENTIAL/PLATELET
Basophils Absolute: 0 10*3/uL (ref 0.0–0.1)
Eosinophils Absolute: 0.1 10*3/uL (ref 0.0–0.5)
HCT: 33 % — ABNORMAL LOW (ref 38.4–49.9)
HGB: 11 g/dL — ABNORMAL LOW (ref 13.0–17.1)
MONO#: 0.6 10*3/uL (ref 0.1–0.9)
NEUT%: 90.5 % — ABNORMAL HIGH (ref 39.0–75.0)
Platelets: 219 10*3/uL (ref 140–400)
WBC: 11.2 10*3/uL — ABNORMAL HIGH (ref 4.0–10.3)
lymph#: 0.4 10*3/uL — ABNORMAL LOW (ref 0.9–3.3)

## 2012-09-05 LAB — COMPREHENSIVE METABOLIC PANEL (CC13)
CO2: 25 mEq/L (ref 22–29)
Calcium: 8.7 mg/dL (ref 8.4–10.4)
Chloride: 102 mEq/L (ref 98–107)
Creatinine: 0.9 mg/dL (ref 0.7–1.3)
Glucose: 126 mg/dl — ABNORMAL HIGH (ref 70–99)
Total Bilirubin: 0.41 mg/dL (ref 0.20–1.20)
Total Protein: 5.7 g/dL — ABNORMAL LOW (ref 6.4–8.3)

## 2012-09-05 NOTE — Patient Instructions (Addendum)
Decrease Prednisone to 50 mg daily. I will contact Dr Henriette Combs office to see if there are any additional treatments needed from his standpoint.

## 2012-09-05 NOTE — Progress Notes (Signed)
[facility]  Telephone:(336) [phone] Fax:(336) [phone]   OFFICE PROGRESS NOTE   Cc:  GATES,ROBERT NEVILL, MD  DIAGNOSIS:  Metastatic melanoma; Braf mutation negative.    PAST THERAPY: biopsy only   CURRENT THERAPY:  [name] on 08/01/2012.   INTERVAL HISTORY: Sean Hunt 79 y.o. male returns for a routine followup by himself.  Received his second cycle of Yervoy about 2 weeks ago. The patient was seen last week for a work in appointment due to diarrhea. At that time he was started on prednisone 60 mg daily and told to alternate Imodium with Lomotil. He is still having loose stools, but these are getting better. He states that they are more bulky at this time. He still has a small amount of mucus in his stool but no blood this time. Reports having approximately 3 stools in a 24-hour period. Most of his loose stools occur during the night. He has mild fatigue. No chest pain, shortness of breath, dyspnea. No abdominal pain, nausea, vomiting. No rashes. No fevers.  He remains independent in activities of daily living. He has been trying to eat and he is drinking a lot of water. No headaches or dizziness. The rest of the 14-point review of system was negative.   Past Medical History  Diagnosis Date  . Aortic arch aneurysm   . Aortic valve disorder   . HTN (hypertension)   . Hyperlipidemia   . Metastatic melanoma 07/06/2012    to left adrenal gland and left lung; BRAF mutation negative.   . Lung mass 07/06/2012  . Melanoma 08/23/2005    Resected by Dr. Jorja Loa.  Clarks level IV; Breslows 1.20mm; no ulcertaion. Positive margins.  No further info as to whether he had re-excision.   . Atrial fibrillation   . Right inguinal hernia   . Urothelial cancer 07/26/2012    Past Surgical History  Procedure Laterality Date  . Aortic valve surgery    . Aortic arch repair    . Thoracotomy Left 1977    small partial resection, for "clot?"  . Coronary artery bypass graft  2000     x 4  . Inguinal hernia repair Bilateral yrs ago  . Cystoscopy with retrograde pyelogram, ureteroscopy and stent placement Right 07/23/2012    Procedure: CYSTOSCOPY WITH RETROGRADE PYELOGRAM, URETEROSCOPY WITH RESECTIOIN OF RENAL PELVIC TUMOR AND RIGHT STENT PLACEMENT ;  Surgeon: Crecencio Mc, MD;  Location: [facility];  Service: Urology;  Laterality: Right;  . Holmium laser application Right 07/23/2012    Procedure: HOLMIUM LASER APPLICATION OF RIGHT RENAL PELVIC TUMOR;  Surgeon: Crecencio Mc, MD;  Location: [facility];  Service: Urology;  Laterality: Right;    Current Outpatient Prescriptions  Medication Sig Dispense Refill  . aspirin 81 MG tablet Take 81 mg by mouth daily.      . cholecalciferol (VITAMIN D) 1000 UNITS tablet Take 1,000 Units by mouth daily.      . diphenoxylate-atropine (LOMOTIL) 2.5-0.025 MG per tablet Take 2 tablets by mouth 4 (four) times daily as needed for diarrhea or loose stools.  60 tablet  1  . metoprolol (LOPRESSOR) 50 MG tablet Take 25 mg by mouth daily before breakfast. Takes 1/2 tablet      . predniSONE (DELTASONE) 20 MG tablet Take 50 mg by mouth daily.      . rosuvastatin (CRESTOR) 20 MG tablet Take 20 mg by mouth daily before breakfast.       . warfarin (COUMADIN) 5 MG  tablet Take 7.5 mg by mouth daily.       No current facility-administered medications for this visit.    ALLERGIES:  has No Known Allergies.  REVIEW OF SYSTEMS:  The rest of the 14-point review of system was negative.   Filed Vitals:   09/05/12 1111  BP: 122/59  Pulse: 72  Temp: 97.6 F (36.4 C)  Resp: 17   Wt Readings from Last 3 Encounters:  09/05/12 131 lb 12.8 oz (59.784 kg)  08/31/12 133 lb 6.4 oz (60.51 kg)  08/22/12 135 lb 1.6 oz (61.281 kg)   ECOG Performance status:  ECOG 1  PHYSICAL EXAMINATION:  General:  well-nourished man, in no acute distress.  Eyes:  no scleral icterus.  ENT:  There were no oropharyngeal lesions.  Neck was without thyromegaly.  Lymphatics:  Negative  cervical, supraclavicular or axillary adenopathy.  Respiratory: lungs were clear bilaterally without wheezing or crackles.  Cardiovascular:  Regular rate and rhythm, S1/S2, without murmur, rub or gallop.  There was no pedal edema.  GI:  abdomen was soft, flat, nontender, nondistended, without organomegaly.  Muscoloskeletal:  no spinal tenderness of palpation of vertebral spine.  Skin exam was without echymosis, petichae.  Neuro exam was nonfocal.  Patient was able to get on and off exam table without assistance.  Gait was normal.  Patient was alert and oriented.  Attention was good.   Language was appropriate.  Mood was normal without depression.  Speech was not pressured.  Thought content was not tangential.     LABORATORY/RADIOLOGY DATA:  Lab Results  Component Value Date   WBC 11.2* 09/05/2012   HGB 11.0* 09/05/2012   HCT 33.0* 09/05/2012   PLT 219 09/05/2012   GLUCOSE 126* 09/05/2012   ALKPHOS 65 09/05/2012   ALT 7 09/05/2012   AST 10 09/05/2012   NA 136 09/05/2012   K 3.9 09/05/2012   CL 102 09/05/2012   CREATININE 0.9 09/05/2012   BUN 15.3 09/05/2012   CO2 25 09/05/2012   INR 1.05 07/23/2012     ASSESSMENT AND PLAN:    1. Metastatic melanoma with disease to the left lung and left adrenal gland; biopsy proven; Braf negative. - Status post 2 doses of Yervoy. Now with grade 2-3 diarrhea secondary to the [facility]. Recommend that he decrease prednisone dose to 50 mg daily. He will continue to alternate Imodium and Lomotil. The patient shared with me today that he has a history of ulcerative colitis which are not aware of previously. He states his medication for this in the past. I have contacted his gastroenterologist office (Dr. Laural Benes) to see if he can assist [place] with management of the patient's diarrhea.  2.  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    3. Afib and history of of valve replacement: He is on Coumadin and rate control with metoprolol.  His INR has  been sub therapeutic per his report.  I advised him to talk with his Coumadin clinic at his cardiologist's office for advise on titration of Coumadin.   4.  Code status:  DO NOT RESUSCITATE/DO NOT INTUBATE as previously discussed.   5.  followup: He is scheduled back with me next week for his third dose of Yervoy. If his diarrhea has not resolved we may need to delay his next dose.   Case reviewed with Dr Gaylyn Rong.

## 2012-09-07 ENCOUNTER — Telehealth: Payer: Self-pay | Admitting: *Deleted

## 2012-09-07 NOTE — Telephone Encounter (Signed)
Per Clenton Pare, NP;   GI MD, Dr. Laural Benes, is requesting pt have stool checked for C-diff.   Left pt VM to return nurse's call so I can instruct him on new order.  Waiting for return call.

## 2012-09-07 NOTE — Telephone Encounter (Signed)
Left another vm for pt to return nurse's call so I can inform him about order to check stool for c-diff.

## 2012-09-11 ENCOUNTER — Telehealth: Payer: Self-pay | Admitting: *Deleted

## 2012-09-11 ENCOUNTER — Other Ambulatory Visit: Payer: Self-pay | Admitting: Oncology

## 2012-09-11 DIAGNOSIS — R197 Diarrhea, unspecified: Secondary | ICD-10-CM

## 2012-09-11 NOTE — Telephone Encounter (Signed)
Pt came by our office and picked up collection kit for stool specimen.  He will bring specimen back with him on his appt tomorrow morning.

## 2012-09-11 NOTE — Telephone Encounter (Signed)
Order has been entered

## 2012-09-11 NOTE — Telephone Encounter (Signed)
Pt returned nurse's call from Friday.  He states his diarrhea has not improved much.  Less blood, but mucous the same.  Aprox 5 to 6 small stools daily with alternating the lomotil and imodium as directed.  Instructed pt on need to collect stool specimen for C-diff per Dr. Laural Benes.  Asked pt to come in today to pick up collection kit so he can bring [place] stool specimen tomorrow morning on next appt.Marland Kitchen He verbalized understanding.

## 2012-09-12 ENCOUNTER — Telehealth: Payer: Self-pay | Admitting: *Deleted

## 2012-09-12 ENCOUNTER — Other Ambulatory Visit: Payer: Medicare Other | Admitting: Lab

## 2012-09-12 ENCOUNTER — Ambulatory Visit (HOSPITAL_BASED_OUTPATIENT_CLINIC_OR_DEPARTMENT_OTHER): Payer: Medicare Other | Admitting: Oncology

## 2012-09-12 ENCOUNTER — Telehealth: Payer: Self-pay | Admitting: Oncology

## 2012-09-12 ENCOUNTER — Encounter: Payer: Self-pay | Admitting: Oncology

## 2012-09-12 ENCOUNTER — Other Ambulatory Visit: Payer: Self-pay | Admitting: Oncology

## 2012-09-12 ENCOUNTER — Other Ambulatory Visit (HOSPITAL_BASED_OUTPATIENT_CLINIC_OR_DEPARTMENT_OTHER): Payer: Medicare Other | Admitting: Lab

## 2012-09-12 ENCOUNTER — Ambulatory Visit: Payer: Medicare Other

## 2012-09-12 VITALS — BP 131/60 | HR 97 | Temp 97.1°F | Resp 18 | Ht 70.0 in | Wt 127.6 lb

## 2012-09-12 DIAGNOSIS — C439 Malignant melanoma of skin, unspecified: Secondary | ICD-10-CM

## 2012-09-12 DIAGNOSIS — C7800 Secondary malignant neoplasm of unspecified lung: Secondary | ICD-10-CM

## 2012-09-12 DIAGNOSIS — C799 Secondary malignant neoplasm of unspecified site: Secondary | ICD-10-CM

## 2012-09-12 DIAGNOSIS — C7970 Secondary malignant neoplasm of unspecified adrenal gland: Secondary | ICD-10-CM

## 2012-09-12 DIAGNOSIS — R197 Diarrhea, unspecified: Secondary | ICD-10-CM

## 2012-09-12 DIAGNOSIS — R5383 Other fatigue: Secondary | ICD-10-CM

## 2012-09-12 DIAGNOSIS — I4891 Unspecified atrial fibrillation: Secondary | ICD-10-CM

## 2012-09-12 LAB — CBC WITH DIFFERENTIAL/PLATELET
BASO%: 0.1 % (ref 0.0–2.0)
EOS%: 0.7 % (ref 0.0–7.0)
HCT: 32.9 % — ABNORMAL LOW (ref 38.4–49.9)
HGB: 11 g/dL — ABNORMAL LOW (ref 13.0–17.1)
MCHC: 33.6 g/dL (ref 32.0–36.0)
MONO#: 0.4 10*3/uL (ref 0.1–0.9)
NEUT%: 87.7 % — ABNORMAL HIGH (ref 39.0–75.0)
RDW: 14.5 % (ref 11.0–14.6)
WBC: 6.8 10*3/uL (ref 4.0–10.3)
lymph#: 0.3 10*3/uL — ABNORMAL LOW (ref 0.9–3.3)

## 2012-09-12 LAB — CLOSTRIDIUM DIFFICILE BY PCR: Toxigenic C. Difficile by PCR: NEGATIVE

## 2012-09-12 NOTE — Telephone Encounter (Signed)
gv and printeda ppt sched and avs for pt....emailed MW to add tx...pt ok and aware   °

## 2012-09-12 NOTE — Telephone Encounter (Signed)
Notified the patient of his visit with Dr. Laural Benes on Monday, 09/17/2012 at 2:30 PM.  Demographics faxed to Dr. Henriette Combs office [phone]

## 2012-09-12 NOTE — Telephone Encounter (Signed)
Informed pt of Kristin's message below and to expect call from Dr. Henriette Combs office for appt.  He verbalized understanding.

## 2012-09-12 NOTE — Progress Notes (Signed)
[facility]  Telephone:(336) [phone] Fax:(336) [phone]   OFFICE PROGRESS NOTE   Cc:  GATES,ROBERT NEVILL, MD  DIAGNOSIS:  Metastatic melanoma; Braf mutation negative.    PAST THERAPY: biopsy only   CURRENT THERAPY:  [name] on 08/01/2012.   INTERVAL HISTORY: Sean Hunt 79 y.o. male returns for a routine followup with his daughter.  Received his second cycle of Yervoy about 3 weeks ago.  He continues to have ongoing diarrhea; about 3-4 loose stools per day. He remains on prednisone 50 mg daily. He is using Lomotil alternating with Imodium. Denies any blood in his stool, but still has mucus in his stool. Denies fevers. Stool for C. difficile was brought in today and is pending. He has mild fatigue. No chest pain, shortness of breath, dyspnea. No abdominal pain, nausea, vomiting. No rashes. He remains independent in activities of daily living. His appetite has remained good, but he is still losing weight.. No headaches or dizziness. The rest of the 14-point review of system was negative.   Past Medical History  Diagnosis Date  . Aortic arch aneurysm   . Aortic valve disorder   . HTN (hypertension)   . Hyperlipidemia   . Metastatic melanoma 07/06/2012    to left adrenal gland and left lung; BRAF mutation negative.   . Lung mass 07/06/2012  . Melanoma 08/23/2005    Resected by Dr. Jorja Loa.  Clarks level IV; Breslows 1.51mm; no ulcertaion. Positive margins.  No further info as to whether he had re-excision.   . Atrial fibrillation   . Right inguinal hernia   . Urothelial cancer 07/26/2012  . Ulcerative (chronic) enterocolitis     Past Surgical History  Procedure Laterality Date  . Aortic valve surgery    . Aortic arch repair    . Thoracotomy Left 1977    small partial resection, for "clot?"  . Coronary artery bypass graft  2000    x 4  . Inguinal hernia repair Bilateral yrs ago  . Cystoscopy with retrograde pyelogram, ureteroscopy and stent placement  Right 07/23/2012    Procedure: CYSTOSCOPY WITH RETROGRADE PYELOGRAM, URETEROSCOPY WITH RESECTIOIN OF RENAL PELVIC TUMOR AND RIGHT STENT PLACEMENT ;  Surgeon: Crecencio Mc, MD;  Location: [facility];  Service: Urology;  Laterality: Right;  . Holmium laser application Right 07/23/2012    Procedure: HOLMIUM LASER APPLICATION OF RIGHT RENAL PELVIC TUMOR;  Surgeon: Crecencio Mc, MD;  Location: [facility];  Service: Urology;  Laterality: Right;    Current Outpatient Prescriptions  Medication Sig Dispense Refill  . aspirin 81 MG tablet Take 81 mg by mouth daily.      . cholecalciferol (VITAMIN D) 1000 UNITS tablet Take 1,000 Units by mouth daily.      . diphenoxylate-atropine (LOMOTIL) 2.5-0.025 MG per tablet Take 2 tablets by mouth 4 (four) times daily as needed for diarrhea or loose stools.  60 tablet  1  . metoprolol (LOPRESSOR) 50 MG tablet Take 25 mg by mouth daily before breakfast. Takes 1/2 tablet      . predniSONE (DELTASONE) 20 MG tablet Take 50 mg by mouth daily.      . rosuvastatin (CRESTOR) 20 MG tablet Take 20 mg by mouth daily before breakfast.       . warfarin (COUMADIN) 5 MG tablet Take 7.5 mg by mouth daily.       No current facility-administered medications for this visit.    ALLERGIES:  has No Known Allergies.  REVIEW OF SYSTEMS:  The rest of the 14-point review of system was negative.   Filed Vitals:   09/12/12 0952  BP: 131/60  Pulse: 97  Temp: 97.1 F (36.2 C)  Resp: 18   Wt Readings from Last 3 Encounters:  09/12/12 127 lb 9.6 oz (57.879 kg)  09/05/12 131 lb 12.8 oz (59.784 kg)  08/31/12 133 lb 6.4 oz (60.51 kg)   ECOG Performance status:  ECOG 1  PHYSICAL EXAMINATION:  General:  well-nourished man, in no acute distress.  Eyes:  no scleral icterus.  ENT:  There were no oropharyngeal lesions.  Neck was without thyromegaly.  Lymphatics:  Negative cervical, supraclavicular or axillary adenopathy.  Respiratory: lungs were clear bilaterally without wheezing or crackles.   Cardiovascular:  Regular rate and rhythm, S1/S2, without murmur, rub or gallop.  There was no pedal edema.  GI:  abdomen was soft, flat, nontender, nondistended, without organomegaly.  Muscoloskeletal:  no spinal tenderness of palpation of vertebral spine.  Skin exam was without echymosis, petichae.  Neuro exam was nonfocal.  Patient was able to get on and off exam table without assistance.  Gait was normal.  Patient was alert and oriented.  Attention was good.   Language was appropriate.  Mood was normal without depression.  Speech was not pressured.  Thought content was not tangential.     LABORATORY/RADIOLOGY DATA:  Lab Results  Component Value Date   WBC 6.8 09/12/2012   HGB 11.0* 09/12/2012   HCT 32.9* 09/12/2012   PLT 198 09/12/2012   GLUCOSE 126* 09/05/2012   ALKPHOS 65 09/05/2012   ALT 7 09/05/2012   AST 10 09/05/2012   NA 136 09/05/2012   K 3.9 09/05/2012   CL 102 09/05/2012   CREATININE 0.9 09/05/2012   BUN 15.3 09/05/2012   CO2 25 09/05/2012   INR 1.05 07/23/2012     ASSESSMENT AND PLAN:    1. Metastatic melanoma with disease to the left lung and left adrenal gland; biopsy proven; Braf negative. - Status post 2 doses of Yervoy. Now with grade 2-3 diarrhea secondary to the [facility]. Recommend that he continue prednisone of 50 mg daily. He will continue to alternate Imodium and Lomotil. His stool for C. difficile is pending. If this is positive then we will begin him on metronidazole. He does have a history of ulcerative colitis and once we have the results of the C. difficile we will get him back in for followup with Dr. Laural Benes.  - He is due for his third dose of Yervoy today and we will hold this pending his GI workup.  2.  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    3. Afib and history of of valve replacement: He is on Coumadin and rate control with metoprolol.  His INR was elevated yesterday. Coumadin is currently on hold. Repeat INR scheduled for  next week per PCP.  4. Protein calorie malnutrition. Appetite remains good, but he is losing weight. Recommend that he drink 2-3 cans of Ensure or boost per day. I have also discussed using Resource Breeze or Ensure clear as alternatives.  5.  Code status:  DO NOT RESUSCITATE/DO NOT INTUBATE as previously discussed.   6.  followup: In 2 weeks to reevaluate him prior to his third dose of Yervoy.  Case reviewed with Dr Gaylyn Rong.

## 2012-09-12 NOTE — Telephone Encounter (Signed)
Per staff message and POF I have scheduled appts.  [name]  

## 2012-09-12 NOTE — Patient Instructions (Signed)
You may drink Ensure, Boost, Ensure Clear, or Resource Breeze 2-3 times per day. Continue Prednisone 50 mg daily. Continue to alternate Lomotil and Imodium. We will contact you will your stool results.

## 2012-09-12 NOTE — Telephone Encounter (Signed)
Message copied by Wende Mott on Wed Sep 12, 2012  1:30 PM ------      Message from: Clenton Pare R      Created: Wed Sep 12, 2012 12:43 PM       Please call patient. Stool for c diff is negative. I have left a message for Dr Henriette Combs nurse (GI doctor) and have asked our scheduler to get him an appt ASAP. We will need to get diarrhea under better control before next dose of Yervoy. ------

## 2012-09-17 ENCOUNTER — Other Ambulatory Visit: Payer: Self-pay | Admitting: Gastroenterology

## 2012-09-18 ENCOUNTER — Encounter (HOSPITAL_COMMUNITY): Payer: Self-pay | Admitting: *Deleted

## 2012-09-18 ENCOUNTER — Ambulatory Visit (HOSPITAL_COMMUNITY)
Admission: RE | Admit: 2012-09-18 | Discharge: 2012-09-18 | Disposition: A | Discharge status: Home / Self Care | Payer: Medicare Other | Source: Ambulatory Visit | Attending: Gastroenterology | Admitting: Gastroenterology

## 2012-09-18 ENCOUNTER — Encounter (HOSPITAL_COMMUNITY)
Admission: RE | Disposition: A | Discharge status: Home / Self Care | Payer: Self-pay | Source: Ambulatory Visit | Attending: Gastroenterology

## 2012-09-18 DIAGNOSIS — Z954 Presence of other heart-valve replacement: Secondary | ICD-10-CM | POA: Insufficient documentation

## 2012-09-18 DIAGNOSIS — K219 Gastro-esophageal reflux disease without esophagitis: Secondary | ICD-10-CM | POA: Insufficient documentation

## 2012-09-18 DIAGNOSIS — E7800 Pure hypercholesterolemia, unspecified: Secondary | ICD-10-CM | POA: Insufficient documentation

## 2012-09-18 DIAGNOSIS — Z951 Presence of aortocoronary bypass graft: Secondary | ICD-10-CM | POA: Insufficient documentation

## 2012-09-18 DIAGNOSIS — K5120 Ulcerative (chronic) proctitis without complications: Secondary | ICD-10-CM | POA: Insufficient documentation

## 2012-09-18 DIAGNOSIS — C439 Malignant melanoma of skin, unspecified: Secondary | ICD-10-CM | POA: Insufficient documentation

## 2012-09-18 DIAGNOSIS — R197 Diarrhea, unspecified: Secondary | ICD-10-CM | POA: Insufficient documentation

## 2012-09-18 DIAGNOSIS — I10 Essential (primary) hypertension: Secondary | ICD-10-CM | POA: Insufficient documentation

## 2012-09-18 DIAGNOSIS — I2510 Atherosclerotic heart disease of native coronary artery without angina pectoris: Secondary | ICD-10-CM | POA: Insufficient documentation

## 2012-09-18 DIAGNOSIS — C801 Malignant (primary) neoplasm, unspecified: Secondary | ICD-10-CM | POA: Insufficient documentation

## 2012-09-18 HISTORY — PX: FLEXIBLE SIGMOIDOSCOPY: SHX5431

## 2012-09-18 SURGERY — SIGMOIDOSCOPY, FLEXIBLE
Anesthesia: Moderate Sedation

## 2012-09-18 MED ORDER — SODIUM CHLORIDE 0.9 % IV SOLN: INTRAVENOUS | Status: DC

## 2012-09-18 NOTE — Op Note (Signed)
Procedure: Diagnostic flexible proctosigmoidoscopy to evaluate diarrhea  Endoscopist: Danise Edge  Premedication: None  Procedure: The patient was placed in the left lateral decubitus position. Anal inspection and digital rectal exam were normal. The Pentax pediatric colonoscope was introduced into the rectum and advanced to approximately 50 cm from the anal verge. Colonic preparation for the exam today was good.  There is diffuse, generalized proctocolitis involving the entire visualized large intestine. There is generalized loss in the mucosal vascular pattern and friable mucosa without deep ulceration or pseudomembrane formation.  The patient's generalized proctocolitis could be due to an exacerbation of his chronic ulcerative colitis or due to colitis secondary to yervoy.  Recommendations:I will place the patient on oral prednisone 20 MG three times daily instead of prednisone 50 MG each morning.

## 2012-09-18 NOTE — H&P (Signed)
  Problem: Diarrhea following yervoy infusion for metastatic melanoma. Screen for C. difficile toxin negative in the stool.  History: The patient is an 79 year old male born [birth year]. The patient was recently diagnosed with metastatic melanoma and developed profuse diarrhea following and infusion yervoy. Stool was negative for C. difficile toxin. The patient was placed on prednisone 50 mg daily. He has a history of universal ulcerative colitis. On 06/01/2009, the patient's colonoscopy showed inactive ulcerative colitis.  The patient is scheduled to undergo a diagnostic flexible proctosigmoidoscopy to rule out pseudomembranous colitis.  Chronic medications: Aspirin. Atrovent. Crestor. Coumadin.  Past medical and surgical history: Coronary artery disease. Coronary artery bypass grafting. Aortic valve replacement surgery. Hypertension. Hypercholesterolemia. Gastroesophageal reflux. Universal ulcerative colitis. Actinic keratoses. Postherpetic neuralgia. Metastatic melanoma. Right inguinal hernia repair. Kidney tumor removed in April 2014. Partial lung resection in 1997.  Habits: Azulfidine causes GI upset. Tegretol causes sedation and nausea .  Exam: Patient is alert and ambulatory. Cardiac exam reveals a regular rhythm. Lungs are clear to auscultation. Abdomen is soft, flat, and nontender to palpation.  Plan: Proceed with diagnostic flexible proctosigmoidoscopy to rule out pseudomembranous colitis.

## 2012-09-19 ENCOUNTER — Other Ambulatory Visit: Payer: Self-pay | Admitting: *Deleted

## 2012-09-19 ENCOUNTER — Encounter (HOSPITAL_COMMUNITY): Payer: Self-pay | Admitting: Gastroenterology

## 2012-09-19 NOTE — Telephone Encounter (Signed)
Rec'd refill request for Prednisone 20mg  tabs from [facility], however, patient was to have GI workup and now Prednisone is managed by Dr Danise Edge, returned refill request to [facility].

## 2012-09-23 ENCOUNTER — Inpatient Hospital Stay (HOSPITAL_COMMUNITY)
Admission: EM | Admit: 2012-09-23 | Discharge: 2012-09-25 | DRG: 371 | Disposition: A | Discharge status: Home / Self Care | Payer: Medicare Other | Attending: Internal Medicine | Admitting: Internal Medicine

## 2012-09-23 ENCOUNTER — Encounter (HOSPITAL_COMMUNITY): Payer: Self-pay

## 2012-09-23 ENCOUNTER — Emergency Department (HOSPITAL_COMMUNITY): Payer: Medicare Other

## 2012-09-23 ENCOUNTER — Telehealth: Payer: Self-pay | Admitting: Oncology

## 2012-09-23 DIAGNOSIS — C799 Secondary malignant neoplasm of unspecified site: Secondary | ICD-10-CM | POA: Diagnosis present

## 2012-09-23 DIAGNOSIS — C7800 Secondary malignant neoplasm of unspecified lung: Secondary | ICD-10-CM | POA: Diagnosis present

## 2012-09-23 DIAGNOSIS — E871 Hypo-osmolality and hyponatremia: Secondary | ICD-10-CM

## 2012-09-23 DIAGNOSIS — I10 Essential (primary) hypertension: Secondary | ICD-10-CM

## 2012-09-23 DIAGNOSIS — R5381 Other malaise: Secondary | ICD-10-CM

## 2012-09-23 DIAGNOSIS — E785 Hyperlipidemia, unspecified: Secondary | ICD-10-CM | POA: Diagnosis present

## 2012-09-23 DIAGNOSIS — Z66 Do not resuscitate: Secondary | ICD-10-CM | POA: Diagnosis present

## 2012-09-23 DIAGNOSIS — I2510 Atherosclerotic heart disease of native coronary artery without angina pectoris: Secondary | ICD-10-CM

## 2012-09-23 DIAGNOSIS — R000 Tachycardia, unspecified: Secondary | ICD-10-CM | POA: Diagnosis present

## 2012-09-23 DIAGNOSIS — C439 Malignant melanoma of skin, unspecified: Secondary | ICD-10-CM | POA: Diagnosis present

## 2012-09-23 DIAGNOSIS — Z951 Presence of aortocoronary bypass graft: Secondary | ICD-10-CM

## 2012-09-23 DIAGNOSIS — I4891 Unspecified atrial fibrillation: Secondary | ICD-10-CM

## 2012-09-23 DIAGNOSIS — R531 Weakness: Secondary | ICD-10-CM

## 2012-09-23 DIAGNOSIS — R0609 Other forms of dyspnea: Secondary | ICD-10-CM

## 2012-09-23 DIAGNOSIS — C7970 Secondary malignant neoplasm of unspecified adrenal gland: Secondary | ICD-10-CM | POA: Diagnosis present

## 2012-09-23 DIAGNOSIS — R197 Diarrhea, unspecified: Secondary | ICD-10-CM

## 2012-09-23 DIAGNOSIS — K51219 Ulcerative (chronic) proctitis with unspecified complications: Secondary | ICD-10-CM

## 2012-09-23 DIAGNOSIS — E860 Dehydration: Secondary | ICD-10-CM

## 2012-09-23 DIAGNOSIS — IMO0002 Reserved for concepts with insufficient information to code with codable children: Secondary | ICD-10-CM

## 2012-09-23 DIAGNOSIS — Z9221 Personal history of antineoplastic chemotherapy: Secondary | ICD-10-CM

## 2012-09-23 DIAGNOSIS — D649 Anemia, unspecified: Secondary | ICD-10-CM

## 2012-09-23 DIAGNOSIS — A0472 Enterocolitis due to Clostridium difficile, not specified as recurrent: Secondary | ICD-10-CM | POA: Diagnosis present

## 2012-09-23 DIAGNOSIS — K5130 Ulcerative (chronic) rectosigmoiditis without complications: Secondary | ICD-10-CM | POA: Diagnosis present

## 2012-09-23 DIAGNOSIS — K5120 Ulcerative (chronic) proctitis without complications: Secondary | ICD-10-CM

## 2012-09-23 DIAGNOSIS — Z681 Body mass index (BMI) 19 or less, adult: Secondary | ICD-10-CM

## 2012-09-23 DIAGNOSIS — Z7901 Long term (current) use of anticoagulants: Secondary | ICD-10-CM | POA: Diagnosis present

## 2012-09-23 DIAGNOSIS — E43 Unspecified severe protein-calorie malnutrition: Secondary | ICD-10-CM | POA: Diagnosis present

## 2012-09-23 DIAGNOSIS — Z79899 Other long term (current) drug therapy: Secondary | ICD-10-CM

## 2012-09-23 DIAGNOSIS — D638 Anemia in other chronic diseases classified elsewhere: Secondary | ICD-10-CM | POA: Diagnosis present

## 2012-09-23 LAB — BASIC METABOLIC PANEL
BUN: 17 mg/dL (ref 6–23)
CO2: 31 mEq/L (ref 19–32)
Chloride: 94 mEq/L — ABNORMAL LOW (ref 96–112)
Creatinine, Ser: 0.86 mg/dL (ref 0.50–1.35)
Glucose, Bld: 132 mg/dL — ABNORMAL HIGH (ref 70–99)

## 2012-09-23 LAB — URINALYSIS, ROUTINE W REFLEX MICROSCOPIC
Glucose, UA: NEGATIVE mg/dL
Ketones, ur: NEGATIVE mg/dL
Leukocytes, UA: NEGATIVE
pH: 6 (ref 5.0–8.0)

## 2012-09-23 LAB — CBC WITH DIFFERENTIAL/PLATELET
Basophils Absolute: 0 10*3/uL (ref 0.0–0.1)
Eosinophils Relative: 1 % (ref 0–5)
HCT: 30.8 % — ABNORMAL LOW (ref 39.0–52.0)
Lymphocytes Relative: 12 % (ref 12–46)
MCV: 86.8 fL (ref 78.0–100.0)
Monocytes Absolute: 0.4 10*3/uL (ref 0.1–1.0)
RDW: 15.1 % (ref 11.5–15.5)
WBC: 5.4 10*3/uL (ref 4.0–10.5)

## 2012-09-23 LAB — PRO B NATRIURETIC PEPTIDE: Pro B Natriuretic peptide (BNP): 1832 pg/mL — ABNORMAL HIGH (ref 0–450)

## 2012-09-23 LAB — TROPONIN I: Troponin I: <0.3 ng/mL (ref <0.30)

## 2012-09-23 LAB — CLOSTRIDIUM DIFFICILE BY PCR: Toxigenic C. Difficile by PCR: POSITIVE — AB

## 2012-09-23 MED ORDER — WARFARIN - PHARMACIST DOSING INPATIENT: Freq: Every day | Status: DC

## 2012-09-23 MED ORDER — SODIUM CHLORIDE 0.9 % IV SOLN
INTRAVENOUS | Status: DC
  Administered 2012-09-24 (×2): via INTRAVENOUS

## 2012-09-23 MED ORDER — SODIUM CHLORIDE 0.9 % IV SOLN
INTRAVENOUS | Status: DC
  Administered 2012-09-23: 19:00:00 via INTRAVENOUS

## 2012-09-23 MED ORDER — DIPHENOXYLATE-ATROPINE 2.5-0.025 MG PO TABS: 2.0000 | ORAL_TABLET | Freq: Four times a day (QID) | ORAL | Status: DC | PRN

## 2012-09-23 MED ORDER — SODIUM CHLORIDE 0.9 % IJ SOLN
3.0000 mL | Freq: Two times a day (BID) | INTRAMUSCULAR | Status: DC
  Administered 2012-09-24 (×2): 3 mL via INTRAVENOUS

## 2012-09-23 MED ORDER — SODIUM CHLORIDE 0.9 % IV BOLUS (SEPSIS)
700.0000 mL | Freq: Once | INTRAVENOUS | Status: AC
  Administered 2012-09-23: 700 mL via INTRAVENOUS

## 2012-09-23 MED ORDER — ATORVASTATIN CALCIUM 40 MG PO TABS
40.0000 mg | ORAL_TABLET | Freq: Every day | ORAL | Status: DC
  Administered 2012-09-23 – 2012-09-24 (×2): 40 mg via ORAL
  Filled 2012-09-23 (×4): qty 1

## 2012-09-23 MED ORDER — SODIUM CHLORIDE 0.9 % IV SOLN
INTRAVENOUS | Status: DC
  Administered 2012-09-23: 13:00:00 via INTRAVENOUS

## 2012-09-23 MED ORDER — VITAMIN D3 25 MCG (1000 UNIT) PO TABS
1000.0000 [IU] | ORAL_TABLET | Freq: Every day | ORAL | Status: DC
  Administered 2012-09-24 – 2012-09-25 (×2): 1000 [IU] via ORAL
  Filled 2012-09-23 (×2): qty 1

## 2012-09-23 MED ORDER — METHYLPREDNISOLONE SODIUM SUCC 125 MG IJ SOLR
60.0000 mg | Freq: Three times a day (TID) | INTRAMUSCULAR | Status: DC
  Administered 2012-09-23 – 2012-09-24 (×2): 60 mg via INTRAVENOUS
  Filled 2012-09-23 (×5): qty 0.96

## 2012-09-23 MED ORDER — METOPROLOL TARTRATE 25 MG PO TABS
25.0000 mg | ORAL_TABLET | Freq: Two times a day (BID) | ORAL | Status: DC
  Administered 2012-09-23 – 2012-09-25 (×4): 25 mg via ORAL
  Filled 2012-09-23 (×5): qty 1

## 2012-09-23 MED ORDER — LOPERAMIDE HCL 2 MG PO CAPS: 2.0000 mg | ORAL_CAPSULE | ORAL | Status: DC | PRN

## 2012-09-23 MED ORDER — PANTOPRAZOLE SODIUM 40 MG PO TBEC
40.0000 mg | DELAYED_RELEASE_TABLET | Freq: Every day | ORAL | Status: DC
  Administered 2012-09-23 – 2012-09-25 (×3): 40 mg via ORAL
  Filled 2012-09-23 (×3): qty 1

## 2012-09-23 MED ORDER — WARFARIN SODIUM 10 MG PO TABS
10.0000 mg | ORAL_TABLET | Freq: Once | ORAL | Status: AC
  Administered 2012-09-23: 10 mg via ORAL
  Filled 2012-09-23: qty 1

## 2012-09-23 NOTE — ED Notes (Addendum)
Patient in afib. Takes coumadin. Patient has +3Pitting edema to bilateral feet.

## 2012-09-23 NOTE — ED Provider Notes (Signed)
History     CSN: [id number]  Arrival date & time 09/23/12  1132   First MD Initiated Contact with Patient 09/23/12 1205      Chief Complaint  Patient presents with  . Weakness  . Diarrhea    (Consider location/radiation/quality/duration/timing/severity/associated sxs/prior treatment) HPI Patient has history of melanoma that was removed from the skin on his chest and 2007. He was diagnosed metastasis to his lung and adrenal gland February. He has gotten 2 rounds of chemotherapy and was supposed to get 4 however he did not tolerate it well. His last chemotherapy was about 3 weeks ago. He reports he has no energy and has "no get up and going". He states it started 2-3 weeks ago. He has been having diarrhea described as mucus and liquid about 5-10 times a day. He states sometimes it is black. His son reports he is having trouble walking and seems to get short of breath. He has also had decreased appetite. He states sometimes his abdomen hurts but not now. When it does he states it's in his left thigh and his son states as were his metastasis is located. They deny cough, fever, chest pain. He has shortness of breath and dyspnea on exertion and sometimes he feels like his heart is racing but not all the time. Patient has known atrial fibrillation and is followed by Dr. Eldridge Dace and also a cardiologist at [facility]. He is not on oxygen at home.  PCP Dr Wende Neighbors Oncologist Dr Gaylyn Rong GI Dr Laural Benes Cardiologist Dr Eldridge Dace  Past Medical History  Diagnosis Date  . Aortic arch aneurysm   . Aortic valve disorder   . HTN (hypertension)   . Hyperlipidemia   . Metastatic melanoma 07/06/2012    to left adrenal gland and left lung; BRAF mutation negative.   . Lung mass 07/06/2012  . Melanoma 08/23/2005    Resected by Dr. Jorja Loa.  Clarks level IV; Breslows 1.5mm; no ulcertaion. Positive margins.  No further info as to whether he had re-excision.   . Atrial fibrillation   . Right inguinal hernia   . Urothelial  cancer 07/26/2012  . Ulcerative (chronic) enterocolitis     Past Surgical History  Procedure Laterality Date  . Aortic valve surgery    . Aortic arch repair    . Thoracotomy Left 1977    small partial resection, for "clot?"  . Coronary artery bypass graft  2000    x 4  . Inguinal hernia repair Bilateral yrs ago  . Cystoscopy with retrograde pyelogram, ureteroscopy and stent placement Right 07/23/2012    Procedure: CYSTOSCOPY WITH RETROGRADE PYELOGRAM, URETEROSCOPY WITH RESECTIOIN OF RENAL PELVIC TUMOR AND RIGHT STENT PLACEMENT ;  Surgeon: Crecencio Mc, MD;  Location: [facility];  Service: Urology;  Laterality: Right;  . Holmium laser application Right 07/23/2012    Procedure: HOLMIUM LASER APPLICATION OF RIGHT RENAL PELVIC TUMOR;  Surgeon: Crecencio Mc, MD;  Location: [facility];  Service: Urology;  Laterality: Right;  . Flexible sigmoidoscopy N/A 09/18/2012    Procedure: FLEXIBLE SIGMOIDOSCOPY;  Surgeon: Charolett Bumpers, MD;  Location: [facility];  Service: Endoscopy;  Laterality: N/A;    Family History  Problem Relation Age of Onset  . Alzheimer's disease Mother   . Stroke Father   . Hypertension Father   . Cancer Sister     breast cancer;   . Cancer Brother     bladder cancer; lung cancer; melanoma.     History  Substance Use Topics  . Smoking  status: Former Smoker -- 1.00 packs/day for 25 years    Types: Cigarettes    Quit date: 04/19/1975  . Smokeless tobacco: Never Used  . Alcohol Use: No     Comment: Remote history   Lives at home Lives with son   Review of Systems  All other systems reviewed and are negative.    Allergies  Review of patient's allergies indicates no known allergies.  Home Medications   Current Outpatient Rx  Name  Route  Sig  Dispense  Refill  . cholecalciferol (VITAMIN D) 1000 UNITS tablet   Oral   Take 1,000 Units by mouth daily.         . diphenoxylate-atropine (LOMOTIL) 2.5-0.025 MG per tablet   Oral   Take 2 tablets by mouth 4 (four) times  daily as needed for diarrhea or loose stools.   60 tablet   1   . metoprolol (LOPRESSOR) 50 MG tablet   Oral   Take 25 mg by mouth daily before breakfast.          . predniSONE (DELTASONE) 20 MG tablet   Oral   Take 10 mg by mouth daily.          . rosuvastatin (CRESTOR) 20 MG tablet   Oral   Take 20 mg by mouth daily before breakfast.          . warfarin (COUMADIN) 5 MG tablet   Oral   Take 7.5 mg by mouth daily.           BP 98/72  Temp(Src) 97.8 F (36.6 C) (Oral)  Resp 18  SpO2 100%  Vital signs normal except borderline hypertension   Physical Exam  Nursing note and vitals reviewed. Constitutional: He is oriented to person, place, and time.  Non-toxic appearance. He does not appear ill. No distress.  Thin, elderly  HENT:  Head: Normocephalic and atraumatic.  Right Ear: External ear normal.  Left Ear: External ear normal.  Nose: Nose normal. No mucosal edema or rhinorrhea.  Mouth/Throat: Mucous membranes are normal. No dental abscesses or edematous.  Dry tongue  Eyes: Conjunctivae and EOM are normal. Pupils are equal, round, and reactive to light.  Neck: Normal range of motion and full passive range of motion without pain. Neck supple.  Cardiovascular: Normal heart sounds.  An irregularly irregular rhythm present. Tachycardia present.  Exam reveals no gallop and no friction rub.   No murmur heard. Pulmonary/Chest: Effort normal and breath sounds normal. No respiratory distress. He has no wheezes. He has no rhonchi. He has no rales. He exhibits no tenderness and no crepitus.  Abdominal: Soft. Normal appearance and bowel sounds are normal. He exhibits no distension. There is no tenderness. There is no rebound and no guarding.  Musculoskeletal: Normal range of motion. He exhibits no edema and no tenderness.  Moves all extremities well.   Neurological: He is alert and oriented to person, place, and time. He has normal strength. No cranial nerve deficit.   Skin: Skin is warm, dry and intact. No rash noted. No erythema. There is pallor.  Psychiatric: He has a normal mood and affect. His speech is normal and behavior is normal. His mood appears not anxious.    ED Course  Procedures (including critical care time) Medications  0.9 %  sodium chloride infusion ( Intravenous New Bag/Given 09/23/12 1316)  sodium chloride 0.9 % bolus 700 mL (0 mLs Intravenous Stopped 09/23/12 1316)     Patient is noted to be in atrial  flutter, on the monitor when his rate gets old but higher he is noted to have a wider complex and then he will go back down to a more narrow complex with his rate gets lower.  Patient had colonoscopy done on June 3 by Dr. Reece Agar showing diffuse proctocolitis. Patient has a history of ulcerative colitis but had normal colonoscopy in 2011. It was felt he could have colititis  from his yervoy which was stopped. His prednisone was changed from 50 mg once a day to 20 mg 3 times a day.  16:08 Dr Gonzella Lex, admit to observation, tele, team 1  Results for orders placed during the hospital encounter of 09/23/12  BASIC METABOLIC PANEL      Result Value Range   Sodium 131 (*) 135 - 145 mEq/L   Potassium 3.7  3.5 - 5.1 mEq/L   Chloride 94 (*) 96 - 112 mEq/L   CO2 31  19 - 32 mEq/L   Glucose, Bld 132 (*) 70 - 99 mg/dL   BUN 17  6 - 23 mg/dL   Creatinine, Ser 1.61  0.50 - 1.35 mg/dL   Calcium 8.6  8.4 - 09.6 mg/dL   GFR calc non Af Amer 79 (*) >90 mL/min   GFR calc Af Amer >90  >90 mL/min  CBC WITH DIFFERENTIAL      Result Value Range   WBC 5.4  4.0 - 10.5 K/uL   RBC 3.55 (*) 4.22 - 5.81 MIL/uL   Hemoglobin 10.3 (*) 13.0 - 17.0 g/dL   HCT 04.5 (*) 40.9 - 81.1 %   MCV 86.8  78.0 - 100.0 fL   MCH 29.0  26.0 - 34.0 pg   MCHC 33.4  30.0 - 36.0 g/dL   RDW 91.4  78.2 - 95.6 %   Platelets 183  150 - 400 K/uL   Neutrophils Relative % 80 (*) 43 - 77 %   Neutro Abs 4.4  1.7 - 7.7 K/uL   Lymphocytes Relative 12  12 - 46 %   Lymphs Abs 0.6  (*) 0.7 - 4.0 K/uL   Monocytes Relative 7  3 - 12 %   Monocytes Absolute 0.4  0.1 - 1.0 K/uL   Eosinophils Relative 1  0 - 5 %   Eosinophils Absolute 0.1  0.0 - 0.7 K/uL   Basophils Relative 0  0 - 1 %   Basophils Absolute 0.0  0.0 - 0.1 K/uL  APTT      Result Value Range   aPTT 38 (*) 24 - 37 seconds  PROTIME-INR      Result Value Range   Prothrombin Time 17.0 (*) 11.6 - 15.2 seconds   INR 1.42  0.00 - 1.49  URINALYSIS, ROUTINE W REFLEX MICROSCOPIC      Result Value Range   Color, Urine YELLOW  YELLOW   APPearance CLEAR  CLEAR   Specific Gravity, Urine 1.017  1.005 - 1.030   pH 6.0  5.0 - 8.0   Glucose, UA NEGATIVE  NEGATIVE mg/dL   Hgb urine dipstick NEGATIVE  NEGATIVE   Bilirubin Urine NEGATIVE  NEGATIVE   Ketones, ur NEGATIVE  NEGATIVE mg/dL   Protein, ur NEGATIVE  NEGATIVE mg/dL   Urobilinogen, UA 0.2  0.0 - 1.0 mg/dL   Nitrite NEGATIVE  NEGATIVE   Leukocytes, UA NEGATIVE  NEGATIVE  PRO B NATRIURETIC PEPTIDE      Result Value Range   Pro B Natriuretic peptide (BNP) 1832.0 (*) 0 - 450 pg/mL  TROPONIN  I      Result Value Range   Troponin I <0.30  <0.30 ng/mL   Laboratory interpretation all normal except  Hyponatremia, low chloride c/w dehydration, anemia, elevated BNP    Dg Chest Port 1 View  09/23/2012   *RADIOLOGY REPORT*  Clinical Data: Shortness of breath.  Weakness.  PORTABLE CHEST - 1 VIEW  Comparison: 07/19/2012  Findings: The patient has had median sternotomy CABG.  Aortic stent graft is again identified.  There is density at the left lung base, similar appearance to multiple prior studies, consistent with mass based on prior CT.  Prominent interstitial markings appear chronic and stable.  IMPRESSION:  1.  Stable appearance the chest and 2.  Persistent left lower lobe density, consistent with known mass.   Original Report Authenticated By: Norva Pavlov, M.D.     Date: 09/23/2012  Rate: 102  Rhythm: atrial fibrillation  QRS Axis: normal  Intervals: normal   ST/T Wave abnormalities: nonspecific ST/T changes  Conduction Disutrbances:left bundle branch block  Narrative Interpretation:   Old EKG Reviewed: changes noted from 03/05/1999 LBBB is new     1. Weakness   2. Proctocolitis, unspecified complication   3. Anemia   4. Diarrhea   5. Hyponatremia   6. Dehydration   7. Dyspnea on exertion       MDM  patient has history and exam consistent with being dehydrated however he also has an elevated BNP with history of cardiac disease he will need slow IV fluid hydration so that he is not pushed into congestive heart failure. He R. and he has history of dyspnea on exertion. Patient has history of ulcerative colitis and has a recent flareup felt to be from his chemotherapy agent yervoy.          Ward Givens, MD 09/23/12 [phone]

## 2012-09-23 NOTE — Telephone Encounter (Signed)
Medical Oncology on call  Son calling due to ongoing diarrhea "mucous" and increased weakness. Recent EMR information reviewed, including lower endoscopy and increased steroids by GI. Sean Hunt has been held. Recommended that patient come to [facility] ED for evaluation.  L.Bernard Donahoo,MD

## 2012-09-23 NOTE — Progress Notes (Signed)
ANTICOAGULATION CONSULT NOTE - Initial Consult  Pharmacy Consult for Coumadin Indication: atrial fibrillation  No Known Allergies  Patient Measurements:   Heparin Dosing Weight:   Vital Signs: Temp: 97.8 F (36.6 C) (06/08 1153) Temp src: Oral (06/08 1153) BP: 109/61 mmHg (06/08 1500) Pulse Rate: 51 (06/08 1500)  Labs:  Recent Labs  09/23/12 1221 09/23/12 1228  HGB 10.3*  --   HCT 30.8*  --   PLT 183  --   APTT  --  38*  LABPROT  --  17.0*  INR  --  1.42  CREATININE 0.86  --   TROPONINI  --  <0.30    The CrCl is unknown because both a height and weight (above a minimum accepted value) are required for this calculation.   Medical History: Past Medical History  Diagnosis Date  . Aortic arch aneurysm   . Aortic valve disorder   . HTN (hypertension)   . Hyperlipidemia   . Metastatic melanoma 07/06/2012    to left adrenal gland and left lung; BRAF mutation negative.   . Lung mass 07/06/2012  . Melanoma 08/23/2005    Resected by Dr. Jorja Loa.  Clarks level IV; Breslows 1.91mm; no ulcertaion. Positive margins.  No further info as to whether he had re-excision.   . Atrial fibrillation   . Right inguinal hernia   . Urothelial cancer 07/26/2012  . Ulcerative (chronic) enterocolitis     Assessment:  42 yom with metastatic melanoma currently undergoing chemotherapy, h/o atrial fibrillation and aortic valve replacement on Coumadin PTA. Pt admitted 6/8 with weakness, diarrhea. PTA coumadin dose was reportedly 7.5mg  daily with last dose of 09/22/2012. INR 1.42 on admit and looks like INRs the past few months in EPIC has been subtherapeutic as well.  Hgb 10.3, plts ok, no bleeding documented   Goal of Therapy:  INR 2-3 Monitor platelets by anticoagulation protocol: Yes   Plan:   Coumadin 10 mg po x 1 tonight   Daily PT/INR  Pharmacy will f/u  Geoffry Paradise, PharmD, BCPS Pager: [phone] 5:11 PM Pharmacy #: [phone]

## 2012-09-23 NOTE — H&P (Signed)
[facility] History and Physical  Rahmir Beever WUJ:[id number] DOB: 03-02-1929 DOA: 09/23/2012  Referring physician: Dr Devoria Albe PCP: Pearla Dubonnet, MD   Chief Complaint:  Diarrhea for 4 weeks Generalized weakness progressive for one week   HPI:  Mr. Geiman is a pleasant 79 year old male with history of CAD status post CABG in 2001, aortic valve repair, hypertension, metastatic melanoma involving the left adrenal and left lung undergoing chemotherapy (follows with Dr. Gaylyn Rong), atrial fibrillation on Coumadin, history of urothelial cancer, history of ulcerative colitis who was brought to the ED by his son for progressive weakness for possible one week associated with dyspnea on exertion. He also has been having watery diarrhea of 6-8 disorder daily for possible one month since he had his last chemotherapy. Patient was started on normal tilt without much improvement. He was seen by his gastroenterologist Dr. Laural Benes a few days back when he had a  flexible proctosigmoidoscopy showing proctocolitis which was thought secondary to either flareup of his ulcerative colitis versus Yervoy infusion for his metastatic melanoma. Stool for C. difficile was negative. Dr. Laural Benes increased his prednisone dose to 20 mg 3 times a day (was on 50 mg daily). Patient reports that his symptoms of diarrhea has not improved and he feels increasingly weak with unsteady gait. Denies noticing any blood in stool. He denies any fever, chills, headache, blurry vision, chest pain, palpitations, orthopnea, PND, nausea, vomiting, abdominal pain, urinary symptoms, leg swelling.   Review of Systems: (Positive symptoms in bold) Constitutional: Denies fever, chills, diaphoresis, appetite change and fatigue.  HEENT: Denies photophobia, eye pain, redness, hearing loss, ear pain, congestion, sore throat, rhinorrhea, sneezing, mouth sores, trouble swallowing, neck pain, neck stiffness and tinnitus.   Respiratory: dyspnea on exertion,  denies cough, chest tightness,  and wheezing.   Cardiovascular: Denies chest pain, palpitations and leg swelling.  Gastrointestinal: Watery diarrhea, Denies nausea, vomiting, abdominal pain, constipation, blood in stool and abdominal distention.  Genitourinary: Denies dysuria, urgency, frequency, hematuria, flank pain and difficulty urinating.  Musculoskeletal: Denies myalgias, back pain, joint swelling, arthralgias and gait problem.  Skin: Denies pallor, rash and wound.  Neurological: generalized weakness, unsteady gait, Denies dizziness, seizures, syncope, weakness, light-headedness, numbness and headaches.  Hematological: Denies adenopathy. Easy bruising, personal or family bleeding history  Psychiatric/Behavioral: Denies mood changes, confusion, nervousness, sleep disturbance and agitation   Past Medical History  Diagnosis Date  . Aortic arch aneurysm   . Aortic valve disorder   . HTN (hypertension)   . Hyperlipidemia   . Metastatic melanoma 07/06/2012    to left adrenal gland and left lung; BRAF mutation negative.   . Lung mass 07/06/2012  . Melanoma 08/23/2005    Resected by Dr. Jorja Loa.  Clarks level IV; Breslows 1.35mm; no ulcertaion. Positive margins.  No further info as to whether he had re-excision.   . Atrial fibrillation   . Right inguinal hernia   . Urothelial cancer 07/26/2012  . Ulcerative (chronic) enterocolitis    Past Surgical History  Procedure Laterality Date  . Aortic valve surgery    . Aortic arch repair    . Thoracotomy Left 1977    small partial resection, for "clot?"  . Coronary artery bypass graft  2000    x 4  . Inguinal hernia repair Bilateral yrs ago  . Cystoscopy with retrograde pyelogram, ureteroscopy and stent placement Right 07/23/2012    Procedure: CYSTOSCOPY WITH RETROGRADE PYELOGRAM, URETEROSCOPY WITH RESECTIOIN OF RENAL PELVIC TUMOR AND RIGHT STENT PLACEMENT ;  Surgeon: Crecencio Mc,  MD;  Location: [facility];  Service: Urology;  Laterality: Right;  .  Holmium laser application Right 07/23/2012    Procedure: HOLMIUM LASER APPLICATION OF RIGHT RENAL PELVIC TUMOR;  Surgeon: Crecencio Mc, MD;  Location: [facility];  Service: Urology;  Laterality: Right;  . Flexible sigmoidoscopy N/A 09/18/2012    Procedure: FLEXIBLE SIGMOIDOSCOPY;  Surgeon: Charolett Bumpers, MD;  Location: [facility];  Service: Endoscopy;  Laterality: N/A;   Social History:  reports that he quit smoking about 37 years ago. His smoking use included Cigarettes. He has a 25 pack-year smoking history. He has never used smokeless tobacco. He reports that he does not drink alcohol or use illicit drugs.  No Known Allergies  Family History  Problem Relation Age of Onset  . Alzheimer's disease Mother   . Stroke Father   . Hypertension Father   . Cancer Sister     breast cancer;   . Cancer Brother     bladder cancer; lung cancer; melanoma.     Prior to Admission medications   Medication Sig Start Date End Date Taking? Authorizing Provider  cholecalciferol (VITAMIN D) 1000 UNITS tablet Take 1,000 Units by mouth daily.   Yes Historical Provider, MD  diphenoxylate-atropine (LOMOTIL) 2.5-0.025 MG per tablet Take 2 tablets by mouth 4 (four) times daily as needed for diarrhea or loose stools. 08/31/12  Yes Myrtis Ser, NP  metoprolol (LOPRESSOR) 50 MG tablet Take 25 mg by mouth daily before breakfast.    Yes Historical Provider, MD  predniSONE (DELTASONE) 20 MG tablet Take 10 mg by mouth daily.  08/01/12  Yes Exie Parody, MD  rosuvastatin (CRESTOR) 20 MG tablet Take 20 mg by mouth daily before breakfast.    Yes Historical Provider, MD  warfarin (COUMADIN) 5 MG tablet Take 7.5 mg by mouth daily.   Yes Historical Provider, MD    Physical Exam:  Filed Vitals:   09/23/12 1311 09/23/12 1400 09/23/12 1430 09/23/12 1500  BP: 115/74 121/62 131/50 109/61  Pulse:  57 83 51  Temp:      TempSrc:      Resp: 19 29 15 18   SpO2:        Constitutional: Vital signs reviewed. Patient is a cachectic  elderly male lying in bed in no acute distress HEENT: No pallor, moist oral mucosa, no lymphadenopathy, no JVD, no icterus Cardiovascular: S1 and S2 is irregularly irregular, no MRG, pulses symmetric and intact bilaterally Pulmonary/Chest: Sternotomy scar, Thoracotomy scar at the back, CTAB, no wheezes, rales, or rhonchi Abdominal: Soft. Non-tender, non-distended, bowel sounds are normal, no masses, organomegaly, or guarding present.  GU: no CVA tenderness Musculoskeletal: No joint deformities, erythema, or stiffness, ROM full and no nontender Ext: no edema and no cyanosis, pulses palpable bilaterally  Hematology: no cervical, inginal, or axillary adenopathy.  Neurological: A&O x3, normal motor tone and strength, normal sensations, nonfocal  Psychiatric: Normal mood and affect. speech and behavior is normal. Judgment and thought content normal. Cognition and memory are normal.   Labs on Admission:  Basic Metabolic Panel:  Recent Labs Lab 09/23/12 1221  NA 131*  K 3.7  CL 94*  CO2 31  GLUCOSE 132*  BUN 17  CREATININE 0.86  CALCIUM 8.6   Liver Function Tests: No results found for this basename: AST, ALT, ALKPHOS, BILITOT, PROT, ALBUMIN,  in the last 168 hours No results found for this basename: LIPASE, AMYLASE,  in the last 168 hours No results found for this basename: AMMONIA,  in  the last 168 hours CBC:  Recent Labs Lab 09/23/12 1221  WBC 5.4  NEUTROABS 4.4  HGB 10.3*  HCT 30.8*  MCV 86.8  PLT 183   Cardiac Enzymes:  Recent Labs Lab 09/23/12 1228  TROPONINI <0.30   BNP: No components found with this basename: POCBNP,  CBG: No results found for this basename: GLUCAP,  in the last 168 hours  Radiological Exams on Admission: Dg Chest Port 1 View  09/23/2012   *RADIOLOGY REPORT*  Clinical Data: Shortness of breath.  Weakness.  PORTABLE CHEST - 1 VIEW  Comparison: 07/19/2012  Findings: The patient has had median sternotomy CABG.  Aortic stent graft is again  identified.  There is density at the left lung base, similar appearance to multiple prior studies, consistent with mass based on prior CT.  Prominent interstitial markings appear chronic and stable.  IMPRESSION:  1.  Stable appearance the chest and 2.  Persistent left lower lobe density, consistent with known mass.   Original Report Authenticated By: Norva Pavlov, M.D.    EKG:Afib at 102, LBBB, no old EKG to compare  Assessment/Plan  Principal Problem:   Weakness generalized Admit to observation telemetry. Likely in the setting of ongoing diarrhea for past several weeks along with poor appetite. Patient appears clinically dehydrated. I will start him on gentle hydration. PT and nutrition evaluation.   Active Problems:   Diarrhea Secondary to ulcerative proctocolitis versus chemotherapy. I would continue his [place] and had Imodium as well. I will change his prednisone to IV Solu-Medrol 60 mg every 8 hours. Dr. Laural Benes needs to be consulted in the morning.    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 Call Dr. Laural Benes in the morning.     Atrial fibrillation Patient gets tachycardic on minimal exertion. Ordered gentle hydration. I will increase his metoprolol dose to 25 mg twice a day. Patient is on Coumadin with subtherapeutic INR. Dose to be  adjusted per pharmacy.    Metastatic melanoma On chemotherapy and follows with Dr Gaylyn Rong who needs to be     CAD (coronary atherosclerotic disease) Hx of CABG and AV repair. Continue statin.  Follows with Dr Eldridge Dace    Elevated Pro BNP  no hx of CHF. Although patient has a dyspnea on exertion I think this is primarily related to his weakness, lung mass  and dehydration rather than CHF. I will order a 2-D echo. He reports having an echo one year back at [facility].  Diet: Cardiac  Code Status: DO NOT RESUSCITATE Family Communication: Son and daughter at bedside Disposition Plan: Pending PT eval  [facility] Pager [phone]  If 7PM-7AM, please contact night-coverage [email or web address] Password [facility] 09/23/2012, 4:58 PM  Total time spent: 55 minutes   Voicemail left for Dr. Marden Noble to follow patient in the morning.

## 2012-09-23 NOTE — ED Notes (Signed)
Patient was receiving chemotherapy for melanoma. Last treatment was mid May. Chemotherapy brought on diarrhea.Patient had a colonoscopy about a week ago because they thought he had ulcerative colitis. Denies vomiting. Says he is severely weak

## 2012-09-24 LAB — PROTIME-INR
INR: 1.92 — ABNORMAL HIGH (ref 0.00–1.49)
Prothrombin Time: 21.2 seconds — ABNORMAL HIGH (ref 11.6–15.2)

## 2012-09-24 MED ORDER — PREDNISONE 20 MG PO TABS
20.0000 mg | ORAL_TABLET | Freq: Every day | ORAL | Status: DC
  Administered 2012-09-25: 20 mg via ORAL
  Filled 2012-09-24 (×2): qty 1

## 2012-09-24 MED ORDER — METRONIDAZOLE 500 MG PO TABS
500.0000 mg | ORAL_TABLET | Freq: Three times a day (TID) | ORAL | Status: DC
  Administered 2012-09-24 – 2012-09-25 (×4): 500 mg via ORAL
  Filled 2012-09-24 (×8): qty 1

## 2012-09-24 MED ORDER — ADULT MULTIVITAMIN W/MINERALS CH
1.0000 | ORAL_TABLET | Freq: Every day | ORAL | Status: DC
  Administered 2012-09-24 – 2012-09-25 (×2): 1 via ORAL
  Filled 2012-09-24 (×2): qty 1

## 2012-09-24 MED ORDER — WARFARIN - PHARMACIST DOSING INPATIENT: Freq: Every day | Status: DC

## 2012-09-24 MED ORDER — METHYLPREDNISOLONE SODIUM SUCC 40 MG IJ SOLR: 20.0000 mg | Freq: Three times a day (TID) | INTRAMUSCULAR | Status: DC

## 2012-09-24 MED ORDER — WARFARIN SODIUM 5 MG PO TABS
5.0000 mg | ORAL_TABLET | Freq: Once | ORAL | Status: AC
  Administered 2012-09-24: 5 mg via ORAL
  Filled 2012-09-24: qty 1

## 2012-09-24 MED ORDER — ENSURE COMPLETE PO LIQD
237.0000 mL | Freq: Two times a day (BID) | ORAL | Status: DC
  Administered 2012-09-24: 237 mL via ORAL

## 2012-09-24 NOTE — Consult Note (Signed)
[facility] INPATIENT PROGRESS NOTE  Name: Sean Hunt      MRN: [id number]    Location: 1505/1505-01  Date: 09/24/2012 Time:8:38 AM   Subjective: Interval History:Treylan Garriga still has diarrhea about 4x yesterday.  He has mild abdominal cramp.  He used to see fresh blood in the stool which has resolved the last few days.  He denied fever, SOB, chest pain.  His skin is dry without active rash. He denied jaundice, nausea/vomiting, headache.  He has anorexia and has lost about 30-lb per his reports over the last few months.   Objective: Vital signs in last 24 hours: Temp:  [97.8 F (36.6 C)-98.9 F (37.2 C)] 98.9 F (37.2 C) (06/08 2220) Pulse Rate:  [51-105] 101 (06/08 2220) Resp:  [15-29] 20 (06/08 2220) BP: (98-138)/(49-97) 119/65 mmHg (06/08 2220) SpO2:  [96 %-100 %] 100 % (06/08 2220) FiO2 (%):  [2 %] 2 % (06/08 1753) Weight:  [122 lb (55.339 kg)] 122 lb (55.339 kg) (06/08 1753)     PHYSICAL EXAM: Gen: thin-appearing man, in no acute distress. Eyes: No scleral icterus or jaundice. ENT: There was no oropharyngeal lesions. Neck was supple without thyromegaly. Lymphatics: Negative for cervical, supraclavicular, axillary, or inguinal adenopathy.  Respiratory: Lungs were clear bilaterally without wheezing or crackles. Cardiovascular: normal heart rate and rhythm; S1/S2; without murmur, rubs, or gallop. There was no pedal edema. GI: Abdomen was soft, flat, nontender, nondistended, without organomegaly. Musculoskeletal exam: No spinal tenderness on palpation of vertebral spine. Skin exam was without ecchymosis, petechiae. Neuro exam was nonfocal. Patient was alert and oriented. Attention was good. Language was appropriate. Mood was normal without depression. Speech was not pressured. Thought content was not tangential.       Studies/Results: Results for orders placed during the hospital encounter of 09/23/12 (from the past 48 hour(s))  BASIC METABOLIC PANEL     Status: Abnormal    Collection Time    09/23/12 12:21 PM      Result Value Range   Sodium 131 (*) 135 - 145 mEq/L   Potassium 3.7  3.5 - 5.1 mEq/L   Chloride 94 (*) 96 - 112 mEq/L   CO2 31  19 - 32 mEq/L   Glucose, Bld 132 (*) 70 - 99 mg/dL   BUN 17  6 - 23 mg/dL   Creatinine, Ser 4.09  0.50 - 1.35 mg/dL   Calcium 8.6  8.4 - 81.1 mg/dL   GFR calc non Af Amer 79 (*) >90 mL/min   GFR calc Af Amer >90  >90 mL/min   Comment:            The eGFR has been calculated     using the CKD EPI equation.     This calculation has not been     validated in all clinical     situations.     eGFR's persistently     <90 mL/min signify     possible Chronic Kidney Disease.  CBC WITH DIFFERENTIAL     Status: Abnormal   Collection Time    09/23/12 12:21 PM      Result Value Range   WBC 5.4  4.0 - 10.5 K/uL   RBC 3.55 (*) 4.22 - 5.81 MIL/uL   Hemoglobin 10.3 (*) 13.0 - 17.0 g/dL   HCT 91.4 (*) 78.2 - 95.6 %   MCV 86.8  78.0 - 100.0 fL   MCH 29.0  26.0 - 34.0 pg   MCHC 33.4  30.0 - 36.0  g/dL   RDW 16.1  09.6 - 04.5 %   Platelets 183  150 - 400 K/uL   Neutrophils Relative % 80 (*) 43 - 77 %   Neutro Abs 4.4  1.7 - 7.7 K/uL   Lymphocytes Relative 12  12 - 46 %   Lymphs Abs 0.6 (*) 0.7 - 4.0 K/uL   Monocytes Relative 7  3 - 12 %   Monocytes Absolute 0.4  0.1 - 1.0 K/uL   Eosinophils Relative 1  0 - 5 %   Eosinophils Absolute 0.1  0.0 - 0.7 K/uL   Basophils Relative 0  0 - 1 %   Basophils Absolute 0.0  0.0 - 0.1 K/uL  APTT     Status: Abnormal   Collection Time    09/23/12 12:28 PM      Result Value Range   aPTT 38 (*) 24 - 37 seconds   Comment:            IF BASELINE aPTT IS ELEVATED,     SUGGEST PATIENT RISK ASSESSMENT     BE USED TO DETERMINE APPROPRIATE     ANTICOAGULANT THERAPY.  PROTIME-INR     Status: Abnormal   Collection Time    09/23/12 12:28 PM      Result Value Range   Prothrombin Time 17.0 (*) 11.6 - 15.2 seconds   INR 1.42  0.00 - 1.49  TROPONIN I     Status: None   Collection Time     09/23/12 12:28 PM      Result Value Range   Troponin I <0.30  <0.30 ng/mL   Comment:            Due to the release kinetics of cTnI,     a negative result within the first hours     of the onset of symptoms does not rule out     myocardial infarction with certainty.     If myocardial infarction is still suspected,     repeat the test at appropriate intervals.  PRO B NATRIURETIC PEPTIDE     Status: Abnormal   Collection Time    09/23/12 12:29 PM      Result Value Range   Pro B Natriuretic peptide (BNP) 1832.0 (*) 0 - 450 pg/mL  URINALYSIS, ROUTINE W REFLEX MICROSCOPIC     Status: None   Collection Time    09/23/12  2:58 PM      Result Value Range   Color, Urine YELLOW  YELLOW   APPearance CLEAR  CLEAR   Specific Gravity, Urine 1.017  1.005 - 1.030   pH 6.0  5.0 - 8.0   Glucose, UA NEGATIVE  NEGATIVE mg/dL   Hgb urine dipstick NEGATIVE  NEGATIVE   Bilirubin Urine NEGATIVE  NEGATIVE   Ketones, ur NEGATIVE  NEGATIVE mg/dL   Protein, ur NEGATIVE  NEGATIVE mg/dL   Urobilinogen, UA 0.2  0.0 - 1.0 mg/dL   Nitrite NEGATIVE  NEGATIVE   Leukocytes, UA NEGATIVE  NEGATIVE   Comment: MICROSCOPIC NOT DONE ON URINES WITH NEGATIVE PROTEIN, BLOOD, LEUKOCYTES, NITRITE, OR GLUCOSE <1000 mg/dL.  CLOSTRIDIUM DIFFICILE BY PCR     Status: Abnormal   Collection Time    09/23/12  5:04 PM      Result Value Range   C difficile by pcr POSITIVE (*) NEGATIVE   Comment: CRITICAL RESULT CALLED TO, READ BACK BY AND VERIFIED WITH:     V.JACKSON,RN 1955 09/23/12 M.CAMPBELL  PROTIME-INR  Status: Abnormal   Collection Time    09/24/12  5:05 AM      Result Value Range   Prothrombin Time 21.2 (*) 11.6 - 15.2 seconds   INR 1.92 (*) 0.00 - 1.49   Dg Chest Port 1 View  09/23/2012   *RADIOLOGY REPORT*  Clinical Data: Shortness of breath.  Weakness.  PORTABLE CHEST - 1 VIEW  Comparison: 07/19/2012  Findings: The patient has had median sternotomy CABG.  Aortic stent graft is again identified.  There is density at  the left lung base, similar appearance to multiple prior studies, consistent with mass based on prior CT.  Prominent interstitial markings appear chronic and stable.  IMPRESSION:  1.  Stable appearance the chest and 2.  Persistent left lower lobe density, consistent with known mass.   Original Report Authenticated By: Norva Pavlov, M.D.     MEDICATIONS: reviewed.     PROBLEM LIST:  - History of ulcerative colistis. - Acute Cdif colitis with diarrhea; not resolved with Prednisone since it was presumed to be flare of his UC.  - Anemia of chronic disease. - Metastatic melanoma.  - Hyponatremia.  - Moderate to severe calorie-protein malnutrition. - Atrial fib on chronic Coumadin.     RECCOMMENDATIONS:  - Hold off on the 2 remaining doses of Yervoy for metastatic melanoma. - Continue with Prednisone at 20mg  PO daily - Continue Flagyl.  - Appreciate GI evaluation. - Follow up with Cancer Center upon discharge.    DNR/DNI as previously discussed.

## 2012-09-24 NOTE — Progress Notes (Signed)
  Echocardiogram 2D Echocardiogram has been performed.  Sean Hunt 09/24/2012, 3:06 PM

## 2012-09-24 NOTE — Evaluation (Signed)
Physical Therapy Evaluation Patient Details Name: Sean Hunt MRN: [id number] DOB: [birth year] Today's Date: 09/24/2012 Time: 6578-4696 PT Time Calculation (min): 11 min  PT Assessment / Plan / Recommendation Clinical Impression  79 y/o male admitted6/8/14 with several days of diarhhea, weakness.  c diff dx. Pt was Independent PTA. Pt is ambulatory mwithout assistance. Pt does have second floor bed/bath. Will see pt for practice of steps then probably DC PT.     PT Assessment  Patient needs continued PT services    Follow Up Recommendations  No PT follow up    Does the patient have the potential to tolerate intense rehabilitation      Barriers to Discharge        Equipment Recommendations  None recommended by PT    Recommendations for Other Services     Frequency Min 3X/week    Precautions / Restrictions Precautions Precaution Comments: cdiff   Pertinent Vitals/Pain      Mobility  Bed Mobility Bed Mobility: Supine to Sit Supine to Sit: 7: Independent Transfers Transfers: Sit to Stand;Stand to Sit Sit to Stand: 7: Independent Ambulation/Gait Ambulation/Gait Assistance: 6: Modified independent (Device/Increase time);7: Independent Ambulation Distance (Feet): 100 Feet Ambulation/Gait Assistance Details: used IV pole but able to walk without it. Gait Pattern: Within Functional Limits    Exercises     PT Diagnosis: Generalized weakness  PT Problem List: Decreased activity tolerance PT Treatment Interventions: Gait training;Stair training   PT Goals Acute Rehab PT Goals PT Goal Formulation: With patient Time For Goal Achievement: 10/01/12 Potential to Achieve Goals: Good Pt will Ambulate: >150 feet;Independently PT Goal: Ambulate - Progress: Goal set today Pt will Go Up / Down Stairs: 6-9 stairs;with supervision;with rail(s) PT Goal: Up/Down Stairs - Progress: Goal set today  Visit Information  Last PT Received On: 09/24/12 Assistance Needed: +1     Subjective Data  Subjective: I am getting up and walking by myself. Patient Stated Goal: to go home when I am stronger.   Prior Functioning  Home Living Lives With: Son Available Help at Discharge: Family;Other (Comment) (son works) Type of Home: House Home Access: Stairs to enter Secretary/administrator of Steps: 1 Entrance Stairs-Rails: None Home Layout: Two level;Bed/bath upstairs Alternate Level Stairs-Number of Steps: 15  Alternate Level Stairs-Rails: Right Bathroom Toilet: Standard Home Adaptive Equipment: None Prior Function Level of Independence: Independent Able to Take Stairs?: Yes Driving: Yes Communication Communication: No difficulties    Cognition  Cognition Arousal/Alertness: Awake/alert Behavior During Therapy: WFL for tasks assessed/performed    Extremity/Trunk Assessment Right Upper Extremity Assessment RUE ROM/Strength/Tone: Within functional levels Left Upper Extremity Assessment LUE ROM/Strength/Tone: Within functional levels Right Lower Extremity Assessment RLE ROM/Strength/Tone: Within functional levels Left Lower Extremity Assessment LLE ROM/Strength/Tone: Within functional levels Trunk Assessment Trunk Assessment: Normal   Balance Balance Balance Assessed: Yes  End of Session PT - End of Session Activity Tolerance: Patient tolerated treatment well Patient left: in bed;with call bell/phone within reach Nurse Communication: Mobility status  GP     Rada Hay 09/24/2012, 11:15 AM Blanchard Kelch PT [phone]

## 2012-09-24 NOTE — Progress Notes (Signed)
Subjective: Mr. Neas had 3 more stool the night.  Non-bloody.  Stool for C. difficile has turned positive.  Objective: Weight change:   Intake/Output Summary (Last 24 hours) at 09/24/12 0812 Last data filed at 09/24/12 0604  Gross per 24 hour  Intake    824 ml  Output    250 ml  Net    574 ml   Filed Vitals:   09/23/12 1630 09/23/12 1700 09/23/12 1753 09/23/12 2220  BP: 125/64 126/64 118/70 119/65  Pulse: 98 105 92 101  Temp:   98.3 F (36.8 C) 98.9 F (37.2 C)  TempSrc:   Oral Oral  Resp: 25 25  20   Height:   5\' 10"  (1.778 m)   Weight:   55.339 kg (122 lb)   SpO2:   96% 100%    General Appearance: Alert, cooperative, no distress, appears stated age Lungs: Clear to auscultation bilaterally, respirations unlabored Heart: Regular rate and rhythm, S1 and S2 normal, no murmur, rub or gallop Abdomen: Soft, mildly tender to palpation, nondistended  Extremities: Extremities normal, atraumatic, no cyanosis or edema Neuro: Alert and oriented, nonfocal  Lab Results: Results for orders placed during the hospital encounter of 09/23/12 (from the past 48 hour(s))  BASIC METABOLIC PANEL     Status: Abnormal   Collection Time    09/23/12 12:21 PM      Result Value Range   Sodium 131 (*) 135 - 145 mEq/L   Potassium 3.7  3.5 - 5.1 mEq/L   Chloride 94 (*) 96 - 112 mEq/L   CO2 31  19 - 32 mEq/L   Glucose, Bld 132 (*) 70 - 99 mg/dL   BUN 17  6 - 23 mg/dL   Creatinine, Ser 1.61  0.50 - 1.35 mg/dL   Calcium 8.6  8.4 - 09.6 mg/dL   GFR calc non Af Amer 79 (*) >90 mL/min   GFR calc Af Amer >90  >90 mL/min   Comment:            The eGFR has been calculated     using the CKD EPI equation.     This calculation has not been     validated in all clinical     situations.     eGFR's persistently     <90 mL/min signify     possible Chronic Kidney Disease.  CBC WITH DIFFERENTIAL     Status: Abnormal   Collection Time    09/23/12 12:21 PM      Result Value Range   WBC 5.4  4.0 - 10.5 K/uL    RBC 3.55 (*) 4.22 - 5.81 MIL/uL   Hemoglobin 10.3 (*) 13.0 - 17.0 g/dL   HCT 04.5 (*) 40.9 - 81.1 %   MCV 86.8  78.0 - 100.0 fL   MCH 29.0  26.0 - 34.0 pg   MCHC 33.4  30.0 - 36.0 g/dL   RDW 91.4  78.2 - 95.6 %   Platelets 183  150 - 400 K/uL   Neutrophils Relative % 80 (*) 43 - 77 %   Neutro Abs 4.4  1.7 - 7.7 K/uL   Lymphocytes Relative 12  12 - 46 %   Lymphs Abs 0.6 (*) 0.7 - 4.0 K/uL   Monocytes Relative 7  3 - 12 %   Monocytes Absolute 0.4  0.1 - 1.0 K/uL   Eosinophils Relative 1  0 - 5 %   Eosinophils Absolute 0.1  0.0 - 0.7 K/uL   Basophils Relative  0  0 - 1 %   Basophils Absolute 0.0  0.0 - 0.1 K/uL  APTT     Status: Abnormal   Collection Time    09/23/12 12:28 PM      Result Value Range   aPTT 38 (*) 24 - 37 seconds   Comment:            IF BASELINE aPTT IS ELEVATED,     SUGGEST PATIENT RISK ASSESSMENT     BE USED TO DETERMINE APPROPRIATE     ANTICOAGULANT THERAPY.  PROTIME-INR     Status: Abnormal   Collection Time    09/23/12 12:28 PM      Result Value Range   Prothrombin Time 17.0 (*) 11.6 - 15.2 seconds   INR 1.42  0.00 - 1.49  TROPONIN I     Status: None   Collection Time    09/23/12 12:28 PM      Result Value Range   Troponin I <0.30  <0.30 ng/mL   Comment:            Due to the release kinetics of cTnI,     a negative result within the first hours     of the onset of symptoms does not rule out     myocardial infarction with certainty.     If myocardial infarction is still suspected,     repeat the test at appropriate intervals.  PRO B NATRIURETIC PEPTIDE     Status: Abnormal   Collection Time    09/23/12 12:29 PM      Result Value Range   Pro B Natriuretic peptide (BNP) 1832.0 (*) 0 - 450 pg/mL  URINALYSIS, ROUTINE W REFLEX MICROSCOPIC     Status: None   Collection Time    09/23/12  2:58 PM      Result Value Range   Color, Urine YELLOW  YELLOW   APPearance CLEAR  CLEAR   Specific Gravity, Urine 1.017  1.005 - 1.030   pH 6.0  5.0 - 8.0    Glucose, UA NEGATIVE  NEGATIVE mg/dL   Hgb urine dipstick NEGATIVE  NEGATIVE   Bilirubin Urine NEGATIVE  NEGATIVE   Ketones, ur NEGATIVE  NEGATIVE mg/dL   Protein, ur NEGATIVE  NEGATIVE mg/dL   Urobilinogen, UA 0.2  0.0 - 1.0 mg/dL   Nitrite NEGATIVE  NEGATIVE   Leukocytes, UA NEGATIVE  NEGATIVE   Comment: MICROSCOPIC NOT DONE ON URINES WITH NEGATIVE PROTEIN, BLOOD, LEUKOCYTES, NITRITE, OR GLUCOSE <1000 mg/dL.  CLOSTRIDIUM DIFFICILE BY PCR     Status: Abnormal   Collection Time    09/23/12  5:04 PM      Result Value Range   C difficile by pcr POSITIVE (*) NEGATIVE   Comment: CRITICAL RESULT CALLED TO, READ BACK BY AND VERIFIED WITH:     V.JACKSON,RN 1955 09/23/12 M.CAMPBELL  PROTIME-INR     Status: Abnormal   Collection Time    09/24/12  5:05 AM      Result Value Range   Prothrombin Time 21.2 (*) 11.6 - 15.2 seconds   INR 1.92 (*) 0.00 - 1.49    Studies/Results: Dg Chest Port 1 View  09/23/2012   *RADIOLOGY REPORT*  Clinical Data: Shortness of breath.  Weakness.  PORTABLE CHEST - 1 VIEW  Comparison: 07/19/2012  Findings: The patient has had median sternotomy CABG.  Aortic stent graft is again identified.  There is density at the left lung base, similar appearance to multiple prior studies, consistent with  mass based on prior CT.  Prominent interstitial markings appear chronic and stable.  IMPRESSION:  1.  Stable appearance the chest and 2.  Persistent left lower lobe density, consistent with known mass.   Original Report Authenticated By: Norva Pavlov, M.D.   Medications: Scheduled Meds: . atorvastatin  40 mg Oral q1800  . cholecalciferol  1,000 Units Oral Daily  . metoprolol  25 mg Oral BID  . metroNIDAZOLE  500 mg Oral Q8H  . pantoprazole  40 mg Oral Daily  . [START ON 09/25/2012] predniSONE  20 mg Oral Q breakfast  . sodium chloride  3 mL Intravenous Q12H   Continuous Infusions: . sodium chloride 100 mL/hr at 09/23/12 1316  . sodium chloride 75 mL/hr at 09/24/12 0604  .  sodium chloride 75 mL/hr at 09/24/12 0740   PRN Meds:.diphenoxylate-atropine, loperamide  Assessment/Plan: Principal Problem:   Weakness generalized - likely multifactorial.  Ongoing diarrhea.  Hopefully will respond to metronidazole for what appears to possibly be C. difficile colitis.  We'll discuss with GI and/or  Dr. 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

## 2012-09-24 NOTE — Progress Notes (Signed)
INITIAL NUTRITION ASSESSMENT  DOCUMENTATION CODES Per approved criteria  -Underweight   INTERVENTION: Recommend liberalizing diet to regular Provide Ensure Complete po BID, each supplement provides 350 kcal and 13 grams of protein. Provide Magic Cup once daily Provide Multivitamin with minerals daily  NUTRITION DIAGNOSIS: Unintentional wt loss related to cancer as evidenced by 10% wt loss in less than 2 months and 4% wt loss in less than 2 weeks.   Goal: Pt to meet >/= 90% of their estimated nutrition needs  Monitor:  PO intake Weight Labs  Reason for Assessment: Consult/ MST  79 y.o. male  Admitting Dx: Weakness generalized  ASSESSMENT: 79 year old male with history of CAD status post CABG in 2001, aortic valve repair, hypertension, metastatic melanoma involving the left adrenal and left lung undergoing chemotherapy (follows with Dr. Gaylyn Rong), atrial fibrillation on Coumadin, history of urothelial cancer, history of ulcerative colitis who was brought to the ED by his son for progressive weakness for possible one week associated with dyspnea on exertion. He also has been having watery diarrhea daily for possible one month since he had his last chemotherapy.   Pt reports that he weighed 155 lbs back in November 2013 but, has been losing weight due to cancer. Pt reports that his appetite has been good until 1 week ago. Pt usually eats 3 meals daily but, he has had a poor appetite for the past week. Pt reports that he only ate 50% of his breakfast because he didn't like the egg whites or has browns.  Pt reports that he drinks nutritional supplements at home occasionally and is interested in trying Ensure supplements. Pt was eating lunch at time of visit which was primarily fruit with some cottage cheese. Encouraged pt to incorporate protein-rich foods at each meal; pt states he will try.   Height: Ht Readings from Last 1 Encounters:  09/23/12 5\' 10"  (1.778 m)    Weight: Wt Readings  from Last 1 Encounters:  09/23/12 122 lb (55.339 kg)    Ideal Body Weight: 166 lbs  % Ideal Body Weight: 73%  Wt Readings from Last 10 Encounters:  09/23/12 122 lb (55.339 kg)  09/12/12 127 lb 9.6 oz (57.879 kg)  09/05/12 131 lb 12.8 oz (59.784 kg)  08/31/12 133 lb 6.4 oz (60.51 kg)  08/22/12 135 lb 1.6 oz (61.281 kg)  08/15/12 133 lb 9.6 oz (60.601 kg)  08/01/12 135 lb 12.8 oz (61.598 kg)  07/19/12 137 lb 12.8 oz (62.506 kg)  07/09/12 138 lb 3.2 oz (62.687 kg)  06/19/12 135 lb (61.236 kg)    Usual Body Weight: 155 lbs (November 2013)  % Usual Body Weight: 79%  BMI:  Body mass index is 17.51 kg/(m^2).  Estimated Nutritional Needs: Kcal: 0865-7846 Protein: 66-78 grams Fluid: 1.7 L  Skin: WDL  Diet Order: Cardiac  EDUCATION NEEDS: -No education needs identified at this time   Intake/Output Summary (Last 24 hours) at 09/24/12 1305 Last data filed at 09/24/12 0604  Gross per 24 hour  Intake    824 ml  Output    250 ml  Net    574 ml    Last BM: 6/8  Labs:   Recent Labs Lab 09/23/12 1221  NA 131*  K 3.7  CL 94*  CO2 31  BUN 17  CREATININE 0.86  CALCIUM 8.6  GLUCOSE 132*    CBG (last 3)  No results found for this basename: GLUCAP,  in the last 72 hours  Scheduled Meds: . atorvastatin  40  mg Oral q1800  . cholecalciferol  1,000 Units Oral Daily  . metoprolol  25 mg Oral BID  . metroNIDAZOLE  500 mg Oral Q8H  . pantoprazole  40 mg Oral Daily  . [START ON 09/25/2012] predniSONE  20 mg Oral Q breakfast  . sodium chloride  3 mL Intravenous Q12H  . warfarin  5 mg Oral ONCE-1800  . Warfarin - Pharmacist Dosing Inpatient   Does not apply q1800    Continuous Infusions: . sodium chloride 100 mL/hr at 09/23/12 1316  . sodium chloride 75 mL/hr at 09/24/12 0604  . sodium chloride 75 mL/hr at 09/24/12 0740    Past Medical History  Diagnosis Date  . Aortic arch aneurysm   . Aortic valve disorder   . HTN (hypertension)   . Hyperlipidemia   .  Metastatic melanoma 07/06/2012    to left adrenal gland and left lung; BRAF mutation negative.   . Lung mass 07/06/2012  . Melanoma 08/23/2005    Resected by Dr. Jorja Loa.  Clarks level IV; Breslows 1.76mm; no ulcertaion. Positive margins.  No further info as to whether he had re-excision.   . Atrial fibrillation   . Right inguinal hernia   . Urothelial cancer 07/26/2012  . Ulcerative (chronic) enterocolitis     Past Surgical History  Procedure Laterality Date  . Aortic valve surgery    . Aortic arch repair    . Thoracotomy Left 1977    small partial resection, for "clot?"  . Coronary artery bypass graft  2000    x 4  . Inguinal hernia repair Bilateral yrs ago  . Cystoscopy with retrograde pyelogram, ureteroscopy and stent placement Right 07/23/2012    Procedure: CYSTOSCOPY WITH RETROGRADE PYELOGRAM, URETEROSCOPY WITH RESECTIOIN OF RENAL PELVIC TUMOR AND RIGHT STENT PLACEMENT ;  Surgeon: Crecencio Mc, MD;  Location: [facility];  Service: Urology;  Laterality: Right;  . Holmium laser application Right 07/23/2012    Procedure: HOLMIUM LASER APPLICATION OF RIGHT RENAL PELVIC TUMOR;  Surgeon: Crecencio Mc, MD;  Location: [facility];  Service: Urology;  Laterality: Right;  . Flexible sigmoidoscopy N/A 09/18/2012    Procedure: FLEXIBLE SIGMOIDOSCOPY;  Surgeon: Charolett Bumpers, MD;  Location: [facility];  Service: Endoscopy;  Laterality: N/A;    Ian Malkin RD, LDN Inpatient Clinical Dietitian Pager: [phone] After Hours Pager: [phone]

## 2012-09-24 NOTE — Progress Notes (Signed)
ANTICOAGULATION CONSULT NOTE - Follow Up  Pharmacy Consult for Coumadin Indication: atrial fibrillation  No Known Allergies  Patient Measurements: Height: 5\' 10"  (177.8 cm) Weight: 122 lb (55.339 kg) IBW/kg (Calculated) : 73 Heparin Dosing Weight:   Vital Signs: Temp: 98.9 F (37.2 C) (06/08 2220) Temp src: Oral (06/08 2220) BP: 119/65 mmHg (06/08 2220) Pulse Rate: 101 (06/08 2220)  Labs:  Recent Labs  09/23/12 1221 09/23/12 1228 09/24/12 0505  HGB 10.3*  --   --   HCT 30.8*  --   --   PLT 183  --   --   APTT  --  38*  --   LABPROT  --  17.0* 21.2*  INR  --  1.42 1.92*  CREATININE 0.86  --   --   TROPONINI  --  <0.30  --     Estimated Creatinine Clearance: 52.7 ml/min (by C-G formula based on Cr of 0.86).   Medical History: Past Medical History  Diagnosis Date  . Aortic arch aneurysm   . Aortic valve disorder   . HTN (hypertension)   . Hyperlipidemia   . Metastatic melanoma 07/06/2012    to left adrenal gland and left lung; BRAF mutation negative.   . Lung mass 07/06/2012  . Melanoma 08/23/2005    Resected by Dr. Jorja Loa.  Clarks level IV; Breslows 1.59mm; no ulcertaion. Positive margins.  No further info as to whether he had re-excision.   . Atrial fibrillation   . Right inguinal hernia   . Urothelial cancer 07/26/2012  . Ulcerative (chronic) enterocolitis     Assessment: 67 yom with metastatic melanoma currently undergoing chemotherapy, h/o atrial fibrillation and aortic valve replacement on Coumadin PTA. Pt admitted 6/8 with weakness, diarrhea. PTA coumadin dose was reportedly 7.5mg  daily with last dose of 09/22/2012. INR 1.42 on admit 6/8 and looks like INRs the past few months in [facility] has been subtherapeutic as well.  Hgb 10.3, plts ok, no bleeding documented   INR just below therapeutic and rose from 1.42 yesterday to 1.92 today  Doses at [facility]: 10mg   No reported bleeding  Note Cdiff positive - Flagyl started by Md causing a significant drug drug  interaction with warfarin  Goal of Therapy:  INR 2-3 Monitor platelets by anticoagulation protocol: Yes   Plan:  1) Due to significant drug drug interaction with Flagyl and rise of INR today, only 5mg  today 2) Daily INR 3) Recommend changing Flagyl to PO vancomycin due to drug drug interaction with warfarin   Hessie Knows, PharmD, BCPS Pager [phone] 09/24/2012 8:25 AM

## 2012-09-25 ENCOUNTER — Other Ambulatory Visit: Payer: Self-pay | Admitting: Oncology

## 2012-09-25 DIAGNOSIS — A0472 Enterocolitis due to Clostridium difficile, not specified as recurrent: Secondary | ICD-10-CM | POA: Diagnosis present

## 2012-09-25 LAB — CBC WITH DIFFERENTIAL/PLATELET
Eosinophils Absolute: 0 10*3/uL (ref 0.0–0.7)
Eosinophils Relative: 0 % (ref 0–5)
HCT: 32 % — ABNORMAL LOW (ref 39.0–52.0)
Lymphocytes Relative: 16 % (ref 12–46)
Lymphs Abs: 1.1 10*3/uL (ref 0.7–4.0)
MCH: 28.9 pg (ref 26.0–34.0)
MCV: 86.5 fL (ref 78.0–100.0)
Monocytes Absolute: 0.6 10*3/uL (ref 0.1–1.0)
RBC: 3.7 MIL/uL — ABNORMAL LOW (ref 4.22–5.81)
RDW: 15.3 % (ref 11.5–15.5)
WBC: 7 10*3/uL (ref 4.0–10.5)

## 2012-09-25 LAB — BASIC METABOLIC PANEL
CO2: 28 mEq/L (ref 19–32)
Calcium: 8.9 mg/dL (ref 8.4–10.5)
Chloride: 99 mEq/L (ref 96–112)
Creatinine, Ser: 0.81 mg/dL (ref 0.50–1.35)
Glucose, Bld: 115 mg/dL — ABNORMAL HIGH (ref 70–99)

## 2012-09-25 LAB — PROTIME-INR: INR: 2.91 — ABNORMAL HIGH (ref 0.00–1.49)

## 2012-09-25 MED ORDER — PANTOPRAZOLE SODIUM 40 MG PO TBEC: 40.0000 mg | DELAYED_RELEASE_TABLET | Freq: Every day | ORAL | Status: AC

## 2012-09-25 MED ORDER — METRONIDAZOLE 500 MG PO TABS: 500.0000 mg | ORAL_TABLET | Freq: Three times a day (TID) | ORAL | Status: AC

## 2012-09-25 MED ORDER — PREDNISONE 20 MG PO TABS: 40.0000 mg | ORAL_TABLET | Freq: Every day | ORAL | Status: DC

## 2012-09-25 MED ORDER — METOPROLOL TARTRATE 50 MG PO TABS: 25.0000 mg | ORAL_TABLET | Freq: Every day | ORAL | Status: AC

## 2012-09-25 NOTE — Progress Notes (Signed)
09/25/12 0820 Patient HR increased due to patient up walking to the bathroom.

## 2012-09-25 NOTE — Discharge Summary (Signed)
Physician Discharge Summary  NAME:Sean Hunt  ZOX:[id number]  DOB: 08/08/1928   Admit date: 09/23/2012 Discharge date: 09/25/2012  Discharge Diagnoses:  Principal Problem:   Weakness generalized - improved Active Problems:   Metastatic melanoma - as per Dr. Raliegh Ip T. Ha - plan is to not continue with the last 2 doses of Yervoy   Diarrhea - persists - will be discharged on therapy for C. difficile colitis and ulcerative proctocolitis   Ulcerative proctocolitis - as above   Dehydration - resolved with intravenous therapy   CAD (coronary atherosclerotic disease) - no symptoms of ischemic heart disease   Hx of CABG   Atrial fibrillation - rate is variable.  Metoprolol has been increased to control rate better.  Heart rate at discharge is approximately 100   Hypertension - controlled   C. difficile colitis - C. difficile toxin on stool positive on admission.  Plan to treat with Flagyl for 2 weeks or if not tolerated will switch to or and vancomycin   Anticoagulation therapy continued upon discharge - continue warfarin as an outpatient to keep INR between 2 and 3   Discharge Physical Exam:  General Appearance: Alert, cooperative, no distress, appears stated age  Weight change:   Intake/Output Summary (Last 24 hours) at 09/25/12 0714 Last data filed at 09/25/12 0400  Gross per 24 hour  Intake   1525 ml  Output      0 ml  Net   1525 ml   Filed Vitals:   09/24/12 1003 09/24/12 1538 09/24/12 2119 09/25/12 0515  BP: 109/60 100/56 126/68 128/67  Pulse: 87 79 98 100  Temp:  97.3 F (36.3 C) 98 F (36.7 C) 97.6 F (36.4 C)  TempSrc:  Oral Oral Oral  Resp:  18 18 20   Height:      Weight:      SpO2:  98% 97% 97%   General Appearance: Alert, cooperative, no distress, appears stated age  Lungs: Clear to auscultation bilaterally, respirations unlabored  Heart: Regular rate and rhythm, S1 and S2 normal, no murmur, rub or gallop  Abdomen: Soft, mildly tender to palpation, nondistended   Extremities: Extremities normal, atraumatic, no cyanosis or edema  Neuro: Alert and oriented, nonfocal   Discharge Condition: Improved  Hospital Course: Sean Hunt is a pleasant 79 year old male with history of CAD status post CABG in 2001, aortic valve repair, hypertension, metastatic melanoma involving the left adrenal and left lung undergoing chemotherapy (follows with Dr. Gaylyn Rong), atrial fibrillation on Coumadin, history of urothelial cancer, history of ulcerative colitis who was brought to the ED by his son for progressive weakness for possible one week associated with dyspnea on exertion and marked diarrhea. He had been having watery diarrhea of 6-8 stools daily for possible one month since he had his last chemotherapy. He was seen by his gastroenterologist Dr. Danise Edge a few days prior to admission when he had a flexible proctosigmoidoscopy showing proctocolitis which was thought secondary to either flareup of his ulcerative colitis versus Yervoy infusion for his metastatic melanoma has been discontinued. Stool for C. difficile was negative but has turned positive on this admission. Dr. Laural Benes had increased his prednisone dose to 20 mg 3 times a day (was on 50 mg daily) prior to this admission. Patient reports that his symptoms of diarrhea has not improved and he feels increasingly weak with unsteady gait. Denies noticing any blood in stool. He was admitted and rehydrated and feels much better overall.  Still having loose stools.  Metronidazole  was just started yesterday.  He has not had any blood per rectum or fevers or chills.  He denies any fever, chills, headache, blurry vision, chest pain, palpitations, orthopnea, PND, nausea, vomiting, abdominal pain, urinary symptoms, leg swelling.   Things to follow up in the outpatient setting:  Number of stools and whether or not he has any blood per rectum  Consults: Treatment Team:  Exie Parody, MD Charolett Bumpers, MD  Disposition: 01-Home or  Self Care  Discharge Orders   Future Appointments Provider Department Dept Phone   09/26/2012 10:15 AM Beverely Pace [facility] [place] [facility] [phone]   09/26/2012 10:45 AM Myrtis Ser, NP [facility] [phone]   09/26/2012 11:45 AM [facility] [place] [facility] [phone]   10/17/2012 9:30 AM [facility] MED[facility] ONCOL[facility] [phone]   10/17/2012 10:00 AM Exie Parody, MD [facility] [phone]   10/17/2012 11:00 AM [facility] [place] [facility] [phone]   Future Orders Complete By Expires     Call MD for:  persistant nausea and vomiting  As directed     Call MD for:  severe uncontrolled pain  As directed     Call MD for:  temperature >100.4  As directed     Diet - low sodium heart healthy  As directed     Discharge instructions  As directed     Comments:      Call [phone] for appointment with Dr. Danise Edge later this week.  Notify M.D. if he developed worsening diarrhea or fever or worsening abdominal pain    Increase activity slowly  As directed         Medication List    TAKE these medications       cholecalciferol 1000 UNITS tablet  Commonly known as:  VITAMIN D  Take 1,000 Units by mouth daily.     diphenoxylate-atropine 2.5-0.025 MG per tablet  Commonly known as:  LOMOTIL  Take 2 tablets by mouth 4 (four) times daily as needed for diarrhea or loose stools.     metoprolol 50 MG tablet  Commonly known as:  LOPRESSOR  Take 0.5 tablets (25 mg total) by mouth daily before breakfast.     metroNIDAZOLE 500 MG tablet  Commonly known as:  FLAGYL  Take 1 tablet (500 mg total) by mouth every 8 (eight) hours.     pantoprazole 40 MG tablet  Commonly known as:  PROTONIX  Take 1 tablet (40 mg total) by mouth daily.     predniSONE 20 MG tablet  Commonly known as:  DELTASONE  Take 2 tablets (40  mg total) by mouth daily.     rosuvastatin 20 MG tablet  Commonly known as:  CRESTOR  Take 20 mg by mouth daily before breakfast.     warfarin 5 MG tablet  Commonly known as:  COUMADIN  Take 7.5 mg by mouth daily.         The results of significant diagnostics from this hospitalization (including imaging, microbiology, ancillary and laboratory) are listed below for reference.    Significant Diagnostic Studies: Dg Chest Port 1 View  09/23/2012   *RADIOLOGY REPORT*  Clinical Data: Shortness of breath.  Weakness.  PORTABLE CHEST - 1 VIEW  Comparison: 07/19/2012  Findings: The patient has had median sternotomy CABG.  Aortic stent graft is again identified.  There is density at the  left lung base, similar appearance to multiple prior studies, consistent with mass based on prior CT.  Prominent interstitial markings appear chronic and stable.  IMPRESSION:  1.  Stable appearance the chest and 2.  Persistent left lower lobe density, consistent with known mass.   Original Report Authenticated By: Norva Pavlov, M.D.    Microbiology: Recent Results (from the past 240 hour(s))  CLOSTRIDIUM DIFFICILE BY PCR     Status: Abnormal   Collection Time    09/23/12  5:04 PM      Result Value Range Status   C difficile by pcr POSITIVE (*) NEGATIVE Final   Comment: CRITICAL RESULT CALLED TO, READ BACK BY AND VERIFIED WITH:     V.JACKSON,RN 1957 09/23/12 M.CAMPBELL  STOOL CULTURE     Status: None   Collection Time    09/23/12  5:04 PM      Result Value Range Status   Specimen Description STOOL   Final   Special Requests Immunocompromised   Final   Culture Culture reincubated for better growth   Final   Report Status PENDING   Incomplete     Labs: Results for orders placed during the hospital encounter of 09/23/12  CLOSTRIDIUM DIFFICILE BY PCR      Result Value Range   C difficile by pcr POSITIVE (*) NEGATIVE  STOOL CULTURE      Result Value Range   Specimen Description STOOL     Special  Requests Immunocompromised     Culture Culture reincubated for better growth     Report Status PENDING    BASIC METABOLIC PANEL      Result Value Range   Sodium 131 (*) 135 - 145 mEq/L   Potassium 3.7  3.5 - 5.1 mEq/L   Chloride 94 (*) 96 - 112 mEq/L   CO2 31  19 - 32 mEq/L   Glucose, Bld 132 (*) 70 - 99 mg/dL   BUN 17  6 - 23 mg/dL   Creatinine, Ser 1.61  0.50 - 1.35 mg/dL   Calcium 8.6  8.4 - 09.6 mg/dL   GFR calc non Af Amer 79 (*) >90 mL/min   GFR calc Af Amer >90  >90 mL/min  CBC WITH DIFFERENTIAL      Result Value Range   WBC 5.4  4.0 - 10.5 K/uL   RBC 3.55 (*) 4.22 - 5.81 MIL/uL   Hemoglobin 10.3 (*) 13.0 - 17.0 g/dL   HCT 04.5 (*) 40.9 - 81.1 %   MCV 86.8  78.0 - 100.0 fL   MCH 29.0  26.0 - 34.0 pg   MCHC 33.4  30.0 - 36.0 g/dL   RDW 91.4  78.2 - 95.6 %   Platelets 183  150 - 400 K/uL   Neutrophils Relative % 80 (*) 43 - 77 %   Neutro Abs 4.4  1.7 - 7.7 K/uL   Lymphocytes Relative 12  12 - 46 %   Lymphs Abs 0.6 (*) 0.7 - 4.0 K/uL   Monocytes Relative 7  3 - 12 %   Monocytes Absolute 0.4  0.1 - 1.0 K/uL   Eosinophils Relative 1  0 - 5 %   Eosinophils Absolute 0.1  0.0 - 0.7 K/uL   Basophils Relative 0  0 - 1 %   Basophils Absolute 0.0  0.0 - 0.1 K/uL  APTT      Result Value Range   aPTT 38 (*) 24 - 37 seconds  PROTIME-INR      Result Value  Range   Prothrombin Time 17.0 (*) 11.6 - 15.2 seconds   INR 1.42  0.00 - 1.49  URINALYSIS, ROUTINE W REFLEX MICROSCOPIC      Result Value Range   Color, Urine YELLOW  YELLOW   APPearance CLEAR  CLEAR   Specific Gravity, Urine 1.017  1.005 - 1.030   pH 6.0  5.0 - 8.0   Glucose, UA NEGATIVE  NEGATIVE mg/dL   Hgb urine dipstick NEGATIVE  NEGATIVE   Bilirubin Urine NEGATIVE  NEGATIVE   Ketones, ur NEGATIVE  NEGATIVE mg/dL   Protein, ur NEGATIVE  NEGATIVE mg/dL   Urobilinogen, UA 0.2  0.0 - 1.0 mg/dL   Nitrite NEGATIVE  NEGATIVE   Leukocytes, UA NEGATIVE  NEGATIVE  PRO B NATRIURETIC PEPTIDE      Result Value Range   Pro  B Natriuretic peptide (BNP) 1832.0 (*) 0 - 450 pg/mL  TROPONIN I      Result Value Range   Troponin I <0.30  <0.30 ng/mL  PROTIME-INR      Result Value Range   Prothrombin Time 21.2 (*) 11.6 - 15.2 seconds   INR 1.92 (*) 0.00 - 1.49  PROTIME-INR      Result Value Range   Prothrombin Time 28.9 (*) 11.6 - 15.2 seconds   INR 2.91 (*) 0.00 - 1.49  BASIC METABOLIC PANEL      Result Value Range   Sodium 133 (*) 135 - 145 mEq/L   Potassium 4.3  3.5 - 5.1 mEq/L   Chloride 99  96 - 112 mEq/L   CO2 28  19 - 32 mEq/L   Glucose, Bld 115 (*) 70 - 99 mg/dL   BUN 13  6 - 23 mg/dL   Creatinine, Ser 1.61  0.50 - 1.35 mg/dL   Calcium 8.9  8.4 - 09.6 mg/dL   GFR calc non Af Amer 81 (*) >90 mL/min   GFR calc Af Amer >90  >90 mL/min  CBC WITH DIFFERENTIAL      Result Value Range   WBC 7.0  4.0 - 10.5 K/uL   RBC 3.70 (*) 4.22 - 5.81 MIL/uL   Hemoglobin 10.7 (*) 13.0 - 17.0 g/dL   HCT 04.5 (*) 40.9 - 81.1 %   MCV 86.5  78.0 - 100.0 fL   MCH 28.9  26.0 - 34.0 pg   MCHC 33.4  30.0 - 36.0 g/dL   RDW 91.4  78.2 - 95.6 %   Platelets 206  150 - 400 K/uL   Neutrophils Relative % 75  43 - 77 %   Neutro Abs 5.3  1.7 - 7.7 K/uL   Lymphocytes Relative 16  12 - 46 %   Lymphs Abs 1.1  0.7 - 4.0 K/uL   Monocytes Relative 8  3 - 12 %   Monocytes Absolute 0.6  0.1 - 1.0 K/uL   Eosinophils Relative 0  0 - 5 %   Eosinophils Absolute 0.0  0.0 - 0.7 K/uL   Basophils Relative 0  0 - 1 %   Basophils Absolute 0.0  0.0 - 0.1 K/uL    Time coordinating discharge: 33 minutes  Signed: Pearla Dubonnet, MD 09/25/2012, 7:14 AM

## 2012-09-25 NOTE — Progress Notes (Signed)
09/25/12 1005 Lab called stating that patients C-Diff sample came back positive. MD wrote antibiotic prescriptions for treatment of infection.

## 2012-09-25 NOTE — Progress Notes (Signed)
09/25/12 1015 Reviewed discharge instructions with patient. Patient verbalized understanding of discharge instructions. Copy of discharge instructions and prescriptions given to patient.

## 2012-09-26 ENCOUNTER — Ambulatory Visit: Payer: Medicare Other

## 2012-09-26 ENCOUNTER — Ambulatory Visit: Payer: Medicare Other | Admitting: Oncology

## 2012-09-26 ENCOUNTER — Telehealth: Payer: Self-pay | Admitting: Oncology

## 2012-09-26 ENCOUNTER — Other Ambulatory Visit: Payer: Medicare Other | Admitting: Lab

## 2012-09-26 LAB — STOOL CULTURE

## 2012-09-26 NOTE — Telephone Encounter (Signed)
s.w. pt son and advised on 6.26.14 appt....ok and aware

## 2012-09-28 ENCOUNTER — Other Ambulatory Visit: Payer: Self-pay | Admitting: Oncology

## 2012-10-02 ENCOUNTER — Telehealth: Payer: Self-pay | Admitting: Oncology

## 2012-10-02 NOTE — Telephone Encounter (Signed)
s.w. pt son and advised on cancellation on 6.26.14 appt and r/s to 6.30.14...Marland Kitchenok and aware

## 2012-10-03 ENCOUNTER — Other Ambulatory Visit: Payer: Medicare Other | Admitting: Lab

## 2012-10-03 ENCOUNTER — Ambulatory Visit: Payer: Medicare Other

## 2012-10-03 ENCOUNTER — Ambulatory Visit: Payer: Medicare Other | Admitting: Oncology

## 2012-10-05 ENCOUNTER — Telehealth: Payer: Self-pay | Admitting: Dietician

## 2012-10-11 ENCOUNTER — Ambulatory Visit: Payer: Medicare Other | Admitting: Oncology

## 2012-10-11 ENCOUNTER — Other Ambulatory Visit: Payer: Medicare Other | Admitting: Lab

## 2012-10-15 ENCOUNTER — Ambulatory Visit (HOSPITAL_COMMUNITY)
Admission: RE | Admit: 2012-10-15 | Discharge: 2012-10-15 | Disposition: A | Discharge status: Home / Self Care | Payer: Medicare Other | Source: Ambulatory Visit | Attending: Oncology | Admitting: Oncology

## 2012-10-15 ENCOUNTER — Ambulatory Visit: Payer: Medicare Other

## 2012-10-15 ENCOUNTER — Encounter: Payer: Self-pay | Admitting: Oncology

## 2012-10-15 ENCOUNTER — Ambulatory Visit (HOSPITAL_BASED_OUTPATIENT_CLINIC_OR_DEPARTMENT_OTHER): Payer: Medicare Other | Admitting: Oncology

## 2012-10-15 ENCOUNTER — Telehealth: Payer: Self-pay | Admitting: Oncology

## 2012-10-15 ENCOUNTER — Other Ambulatory Visit (HOSPITAL_BASED_OUTPATIENT_CLINIC_OR_DEPARTMENT_OTHER): Payer: Medicare Other | Admitting: Lab

## 2012-10-15 VITALS — BP 108/66 | HR 113 | Temp 97.2°F | Resp 18 | Ht 70.0 in | Wt 118.8 lb

## 2012-10-15 DIAGNOSIS — J90 Pleural effusion, not elsewhere classified: Secondary | ICD-10-CM | POA: Insufficient documentation

## 2012-10-15 DIAGNOSIS — C799 Secondary malignant neoplasm of unspecified site: Secondary | ICD-10-CM

## 2012-10-15 DIAGNOSIS — R0600 Dyspnea, unspecified: Secondary | ICD-10-CM

## 2012-10-15 DIAGNOSIS — C439 Malignant melanoma of skin, unspecified: Secondary | ICD-10-CM

## 2012-10-15 DIAGNOSIS — C801 Malignant (primary) neoplasm, unspecified: Secondary | ICD-10-CM

## 2012-10-15 DIAGNOSIS — R0609 Other forms of dyspnea: Secondary | ICD-10-CM

## 2012-10-15 DIAGNOSIS — R0989 Other specified symptoms and signs involving the circulatory and respiratory systems: Secondary | ICD-10-CM

## 2012-10-15 DIAGNOSIS — R5383 Other fatigue: Secondary | ICD-10-CM

## 2012-10-15 DIAGNOSIS — E43 Unspecified severe protein-calorie malnutrition: Secondary | ICD-10-CM

## 2012-10-15 DIAGNOSIS — R222 Localized swelling, mass and lump, trunk: Secondary | ICD-10-CM | POA: Insufficient documentation

## 2012-10-15 LAB — CBC WITH DIFFERENTIAL/PLATELET
BASO%: 0.2 % (ref 0.0–2.0)
Basophils Absolute: 0 10*3/uL (ref 0.0–0.1)
Eosinophils Absolute: 0 10*3/uL (ref 0.0–0.5)
HCT: 33.7 % — ABNORMAL LOW (ref 38.4–49.9)
HGB: 11.3 g/dL — ABNORMAL LOW (ref 13.0–17.1)
MONO#: 0.5 10*3/uL (ref 0.1–0.9)
NEUT#: 9.8 10*3/uL — ABNORMAL HIGH (ref 1.5–6.5)
NEUT%: 82.7 % — ABNORMAL HIGH (ref 39.0–75.0)
WBC: 11.9 10*3/uL — ABNORMAL HIGH (ref 4.0–10.3)
lymph#: 1.5 10*3/uL (ref 0.9–3.3)

## 2012-10-15 LAB — COMPREHENSIVE METABOLIC PANEL (CC13)
Alkaline Phosphatase: 78 U/L (ref 40–150)
CO2: 24 mEq/L (ref 22–29)
Creatinine: 0.8 mg/dL (ref 0.7–1.3)
Glucose: 129 mg/dl (ref 70–140)
Total Bilirubin: 0.6 mg/dL (ref 0.20–1.20)

## 2012-10-15 LAB — TSH: TSH: 4.257 u[IU]/mL (ref 0.350–4.500)

## 2012-10-15 NOTE — Progress Notes (Signed)
[facility]  [phone] [phone]   OFFICE PROGRESS NOTE   Cc:  GATES,ROBERT NEVILL, MD  DIAGNOSIS:  Metastatic melanoma; Braf mutation negative.    PAST THERAPY: biopsy only   CURRENT THERAPY:  [name] on 08/01/2012. Sean Hunt was placed on hold after 2 cycles due to grade 3 diarrhea.  INTERVAL HISTORY: Sean Hunt 79 y.o. male returns for a routine followup with his son.  Sean Hunt has been placed on hold due to grade 3 diarrhea.  He was hospitalized earlier this month due to diarrhea that was positive for C. difficile. He was discharged home on metronidazole. He states that he is currently on another antibiotic 4 times a day prescribed by his gastroenterologist, but he does not have the name of this medication. He continues to have ongoing diarrhea; about 3-4 loose stools per day. He remains on prednisone 20 mg 3 times a day and he is tapering off of this. He continues to have mucus a small amount of blood in his stool. Denies fevers. He is very weak and has dyspnea walking from one part of the house to the other. He is resting a lot more. No chest pain, shortness of breath at rest. No abdominal pain, nausea, vomiting. No rashes. No headaches or dizziness. Appetite is decreased and he has lost 4 more pounds since his hospital discharge. The rest of the 14-point review of system was negative.   Past Medical History  Diagnosis Date  . Aortic arch aneurysm   . Aortic valve disorder   . HTN (hypertension)   . Hyperlipidemia   . Metastatic melanoma 07/06/2012    to left adrenal gland and left lung; BRAF mutation negative.   . Lung mass 07/06/2012  . Melanoma 08/23/2005    Resected by Dr. Jorja Loa.  Clarks level IV; Breslows 1.17mm; no ulcertaion. Positive margins.  No further info as to whether he had re-excision.   . Atrial fibrillation   . Right inguinal hernia   . Urothelial cancer 07/26/2012  . Ulcerative (chronic) enterocolitis     Past  Surgical History  Procedure Laterality Date  . Aortic valve surgery    . Aortic arch repair    . Thoracotomy Left 1977    small partial resection, for "clot?"  . Coronary artery bypass graft  2000    x 4  . Inguinal hernia repair Bilateral yrs ago  . Cystoscopy with retrograde pyelogram, ureteroscopy and stent placement Right 07/23/2012    Procedure: CYSTOSCOPY WITH RETROGRADE PYELOGRAM, URETEROSCOPY WITH RESECTIOIN OF RENAL PELVIC TUMOR AND RIGHT STENT PLACEMENT ;  Surgeon: Crecencio Mc, MD;  Location: [facility];  Service: Urology;  Laterality: Right;  . Holmium laser application Right 07/23/2012    Procedure: HOLMIUM LASER APPLICATION OF RIGHT RENAL PELVIC TUMOR;  Surgeon: Crecencio Mc, MD;  Location: [facility];  Service: Urology;  Laterality: Right;  . Flexible sigmoidoscopy N/A 09/18/2012    Procedure: FLEXIBLE SIGMOIDOSCOPY;  Surgeon: Charolett Bumpers, MD;  Location: [facility];  Service: Endoscopy;  Laterality: N/A;    Current Outpatient Prescriptions  Medication Sig Dispense Refill  . cholecalciferol (VITAMIN D) 1000 UNITS tablet Take 1,000 Units by mouth daily.      . diphenoxylate-atropine (LOMOTIL) 2.5-0.025 MG per tablet Take 2 tablets by mouth 4 (four) times daily as needed for diarrhea or loose stools.  60 tablet  1  . metoprolol (LOPRESSOR) 50 MG tablet Take 0.5 tablets (25 mg total) by mouth daily before breakfast.  60 tablet  1  . pantoprazole (PROTONIX) 40 MG tablet Take 1 tablet (40 mg total) by mouth daily.  30 tablet  1  . predniSONE (DELTASONE) 20 MG tablet Take 2 tablets (40 mg total) by mouth daily.  60 tablet  0  . rosuvastatin (CRESTOR) 20 MG tablet Take 20 mg by mouth daily before breakfast.       . warfarin (COUMADIN) 5 MG tablet Take 7.5 mg by mouth daily.       No current facility-administered medications for this visit.    ALLERGIES:  has No Known Allergies.  REVIEW OF SYSTEMS:  The rest of the 14-point review of system was negative.   Filed Vitals:   10/15/12 1100    BP: 108/66  Pulse: 113  Temp: 97.2 F (36.2 C)  Resp: 18   Wt Readings from Last 3 Encounters:  10/15/12 118 lb 12.8 oz (53.887 kg)  09/23/12 122 lb (55.339 kg)  09/12/12 127 lb 9.6 oz (57.879 kg)   ECOG Performance status:  ECOG 1  PHYSICAL EXAMINATION:  General:  well-nourished man, in no acute distress.  Eyes:  no scleral icterus.  ENT:  There were no oropharyngeal lesions.  Neck was without thyromegaly.  Lymphatics:  Negative cervical, supraclavicular or axillary adenopathy.  Respiratory: lungs were clear bilaterally without wheezing or crackles.  Cardiovascular:  Regular rate and rhythm, S1/S2, without murmur, rub or gallop.  There was no pedal edema.  GI:  abdomen was soft, flat, nontender, nondistended, without organomegaly.  Muscoloskeletal:  no spinal tenderness of palpation of vertebral spine.  Skin exam was without echymosis, petichae.  Neuro exam was nonfocal.  Patient was in a wheelchair today and unable to get on the exam table. Patient was alert and oriented.  Attention was good.   Language was appropriate.  Mood was normal without depression.  Speech was not pressured.  Thought content was not tangential.     LABORATORY/RADIOLOGY DATA:  Lab Results  Component Value Date   WBC 11.9* 10/15/2012   HGB 11.3* 10/15/2012   HCT 33.7* 10/15/2012   PLT 204 10/15/2012   GLUCOSE 129 10/15/2012   ALKPHOS 78 10/15/2012   ALT 15 10/15/2012   AST 18 10/15/2012   NA 137 10/15/2012   K 4.1 10/15/2012   CL 99 09/25/2012   CREATININE 0.8 10/15/2012   BUN 17.3 10/15/2012   CO2 24 10/15/2012   INR 2.91* 09/25/2012     ASSESSMENT AND PLAN:    1. Metastatic melanoma with disease to the left lung and left adrenal gland; biopsy proven; Braf negative. - Status post 2 doses of Yervoy. Now with grade 3 diarrhea secondary to the [facility]. He was treated for C. difficile, but still has ongoing diarrhea. He is being followed by GI and is reportedly on a new antibiotic at this time. We will discontinue  his Yervoy at this time. We will obtain a CT of his chest abdomen and pelvis for restaging in about 3-4 weeks. If he has further progression of his disease then we will recommend hospice.  2.  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    3. Afib and history of of valve replacement: He is on Coumadin and rate control with metoprolol.  His INR is followed by his PCP.  4. Protein calorie malnutrition. Appetite remains poor and he continues to lose weight. I have referred him to our dietitian.  5. dyspnea: The patient has a history of a pleural effusion  and will obtain a chest x-ray today. Will set him up for a thoracentesis if he does have a pleural effusion. He will need to hold his Coumadin for 4-5 days prior to the procedure.  6.  Code status:  DO NOT RESUSCITATE/DO NOT INTUBATE as previously discussed.   7.  followup: In 3-4 weeks to review his CT scan.  Case reviewed with Dr Gaylyn Rong.    Addendum:  *RADIOLOGY REPORT*  Clinical Data: Increased dyspnea with cough for 3 weeks. History  of melanoma with metastatic disease and pleural effusion.  CHEST - 2 VIEW  Comparison: 09/23/2012 radiographs. CT 07/19/2012.  Findings: The heart size and mediastinal contours are stable status  post median sternotomy and ascending aortic stent grafting. The  known left lower lobe mass has enlarged, measuring 6.6 cm in  diameter. The left pleural effusion and left basilar atelectasis  are stable. The right lung appears clear. Generalized  interstitial prominence is stable without focal airspace disease.  IMPRESSION:  Enlarging left lower lobe mass (known metastatic disease on prior  biopsy). Similar generalized interstitial prominence and left  pleural effusion.  Original Report Authenticated By: Carey Bullocks, M.D.  Chest x-ray results reviewed with Dr Gaylyn Rong. We will set the patient up for possible thoracentesis early next week. He will need to hold his Coumadin for 4-5  days prior to this procedure. I have also requested the CT scan be moved up within the next 7-10 days. If the lung mass is obstructing the bronchus, then we will refer him to radiation oncology for consideration of palliative radiation. I discussed these results with the son and he will expect a call from our schedulers.

## 2012-10-15 NOTE — Telephone Encounter (Signed)
gave pt appt for lab and MD,also gave him oral contrast for CT on July 2014,chemo cancelled per pt

## 2012-10-16 ENCOUNTER — Telehealth: Payer: Self-pay | Admitting: Oncology

## 2012-10-16 NOTE — Telephone Encounter (Signed)
, °

## 2012-10-17 ENCOUNTER — Other Ambulatory Visit: Payer: Medicare Other | Admitting: Lab

## 2012-10-17 ENCOUNTER — Ambulatory Visit: Payer: Medicare Other | Admitting: Oncology

## 2012-10-17 ENCOUNTER — Ambulatory Visit: Payer: Medicare Other

## 2012-10-22 ENCOUNTER — Ambulatory Visit (HOSPITAL_COMMUNITY)
Admission: RE | Admit: 2012-10-22 | Discharge: 2012-10-22 | Disposition: A | Discharge status: Home / Self Care | Payer: Medicare Other | Source: Ambulatory Visit | Attending: Oncology | Admitting: Oncology

## 2012-10-22 ENCOUNTER — Ambulatory Visit (HOSPITAL_COMMUNITY)
Admission: RE | Admit: 2012-10-22 | Discharge: 2012-10-22 | Disposition: A | Discharge status: Home / Self Care | Payer: Medicare Other | Source: Ambulatory Visit | Attending: Radiology | Admitting: Radiology

## 2012-10-22 DIAGNOSIS — C801 Malignant (primary) neoplasm, unspecified: Secondary | ICD-10-CM | POA: Insufficient documentation

## 2012-10-22 DIAGNOSIS — C799 Secondary malignant neoplasm of unspecified site: Secondary | ICD-10-CM

## 2012-10-22 DIAGNOSIS — J90 Pleural effusion, not elsewhere classified: Secondary | ICD-10-CM | POA: Insufficient documentation

## 2012-10-22 DIAGNOSIS — R222 Localized swelling, mass and lump, trunk: Secondary | ICD-10-CM | POA: Insufficient documentation

## 2012-10-22 DIAGNOSIS — R0600 Dyspnea, unspecified: Secondary | ICD-10-CM

## 2012-10-22 DIAGNOSIS — C439 Malignant melanoma of skin, unspecified: Secondary | ICD-10-CM | POA: Insufficient documentation

## 2012-10-22 NOTE — Procedures (Signed)
Successful US guided left thoracentesis. Yielded of clear yellow fluid. Pt tolerated procedure well. No immediate complications.  Specimen was sent for labs. CXR ordered.  Brayton El PA-C 10/22/2012 11:52 AM

## 2012-10-23 ENCOUNTER — Ambulatory Visit (HOSPITAL_COMMUNITY)
Admission: RE | Admit: 2012-10-23 | Discharge: 2012-10-23 | Disposition: A | Discharge status: Home / Self Care | Payer: Medicare Other | Source: Ambulatory Visit | Attending: Oncology | Admitting: Oncology

## 2012-10-23 ENCOUNTER — Encounter (HOSPITAL_COMMUNITY): Payer: Self-pay

## 2012-10-23 ENCOUNTER — Other Ambulatory Visit (HOSPITAL_BASED_OUTPATIENT_CLINIC_OR_DEPARTMENT_OTHER): Payer: Medicare Other | Admitting: Lab

## 2012-10-23 DIAGNOSIS — K2289 Other specified disease of esophagus: Secondary | ICD-10-CM | POA: Insufficient documentation

## 2012-10-23 DIAGNOSIS — C7970 Secondary malignant neoplasm of unspecified adrenal gland: Secondary | ICD-10-CM | POA: Insufficient documentation

## 2012-10-23 DIAGNOSIS — C799 Secondary malignant neoplasm of unspecified site: Secondary | ICD-10-CM

## 2012-10-23 DIAGNOSIS — Z9221 Personal history of antineoplastic chemotherapy: Secondary | ICD-10-CM | POA: Insufficient documentation

## 2012-10-23 DIAGNOSIS — J90 Pleural effusion, not elsewhere classified: Secondary | ICD-10-CM | POA: Insufficient documentation

## 2012-10-23 DIAGNOSIS — K228 Other specified diseases of esophagus: Secondary | ICD-10-CM | POA: Insufficient documentation

## 2012-10-23 DIAGNOSIS — R222 Localized swelling, mass and lump, trunk: Secondary | ICD-10-CM | POA: Insufficient documentation

## 2012-10-23 DIAGNOSIS — J438 Other emphysema: Secondary | ICD-10-CM | POA: Insufficient documentation

## 2012-10-23 DIAGNOSIS — C439 Malignant melanoma of skin, unspecified: Secondary | ICD-10-CM | POA: Insufficient documentation

## 2012-10-23 DIAGNOSIS — N281 Cyst of kidney, acquired: Secondary | ICD-10-CM | POA: Insufficient documentation

## 2012-10-23 DIAGNOSIS — K449 Diaphragmatic hernia without obstruction or gangrene: Secondary | ICD-10-CM | POA: Insufficient documentation

## 2012-10-23 LAB — CBC WITH DIFFERENTIAL/PLATELET
Basophils Absolute: 0 10*3/uL (ref 0.0–0.1)
Eosinophils Absolute: 0 10*3/uL (ref 0.0–0.5)
HGB: 11.4 g/dL — ABNORMAL LOW (ref 13.0–17.1)
LYMPH%: 9.9 % — ABNORMAL LOW (ref 14.0–49.0)
MCH: 29.6 pg (ref 27.2–33.4)
MCV: 88.3 fL (ref 79.3–98.0)
MONO%: 3.2 % (ref 0.0–14.0)
NEUT#: 9.2 10*3/uL — ABNORMAL HIGH (ref 1.5–6.5)
NEUT%: 86.4 % — ABNORMAL HIGH (ref 39.0–75.0)
Platelets: 189 10*3/uL (ref 140–400)

## 2012-10-23 LAB — COMPREHENSIVE METABOLIC PANEL (CC13)
Albumin: 2.6 g/dL — ABNORMAL LOW (ref 3.5–5.0)
Alkaline Phosphatase: 75 U/L (ref 40–150)
BUN: 13 mg/dL (ref 7.0–26.0)
Creatinine: 0.7 mg/dL (ref 0.7–1.3)
Glucose: 105 mg/dl (ref 70–140)
Total Bilirubin: 0.52 mg/dL (ref 0.20–1.20)

## 2012-10-23 MED ORDER — IOHEXOL 300 MG/ML  SOLN
80.0000 mL | Freq: Once | INTRAMUSCULAR | Status: AC | PRN
  Administered 2012-10-23: 80 mL via INTRAVENOUS

## 2012-10-25 ENCOUNTER — Other Ambulatory Visit: Payer: Self-pay | Admitting: Oncology

## 2012-10-25 NOTE — Progress Notes (Signed)
Patient referred to Hospice, per Dr. Gaylyn Rong.

## 2012-11-02 ENCOUNTER — Other Ambulatory Visit (HOSPITAL_COMMUNITY): Payer: Medicare Other

## 2012-11-02 ENCOUNTER — Other Ambulatory Visit: Payer: Medicare Other | Admitting: Lab

## 2012-11-05 ENCOUNTER — Encounter: Payer: Medicare Other | Admitting: Nutrition

## 2012-11-05 ENCOUNTER — Ambulatory Visit: Payer: Medicare Other

## 2012-11-05 ENCOUNTER — Other Ambulatory Visit: Payer: Medicare Other | Admitting: Lab

## 2012-11-19 ENCOUNTER — Other Ambulatory Visit: Payer: Medicare Other | Admitting: Lab

## 2012-11-19 ENCOUNTER — Ambulatory Visit: Payer: Medicare Other | Admitting: Hematology and Oncology

## 2013-01-03 ENCOUNTER — Encounter (HOSPITAL_COMMUNITY): Payer: Self-pay | Admitting: *Deleted

## 2013-01-03 ENCOUNTER — Emergency Department (HOSPITAL_COMMUNITY)
Admission: EM | Admit: 2013-01-03 | Discharge: 2013-01-03 | Disposition: A | Discharge status: Home / Self Care | Payer: Medicare Other | Attending: Emergency Medicine | Admitting: Emergency Medicine

## 2013-01-03 ENCOUNTER — Emergency Department (HOSPITAL_COMMUNITY): Payer: Medicare Other

## 2013-01-03 DIAGNOSIS — IMO0002 Reserved for concepts with insufficient information to code with codable children: Secondary | ICD-10-CM | POA: Insufficient documentation

## 2013-01-03 DIAGNOSIS — I10 Essential (primary) hypertension: Secondary | ICD-10-CM | POA: Diagnosis not present

## 2013-01-03 DIAGNOSIS — E785 Hyperlipidemia, unspecified: Secondary | ICD-10-CM | POA: Diagnosis not present

## 2013-01-03 DIAGNOSIS — Z85820 Personal history of malignant melanoma of skin: Secondary | ICD-10-CM | POA: Insufficient documentation

## 2013-01-03 DIAGNOSIS — I4891 Unspecified atrial fibrillation: Secondary | ICD-10-CM | POA: Diagnosis not present

## 2013-01-03 DIAGNOSIS — Z8550 Personal history of malignant neoplasm of unspecified urinary tract organ: Secondary | ICD-10-CM | POA: Insufficient documentation

## 2013-01-03 DIAGNOSIS — Z79899 Other long term (current) drug therapy: Secondary | ICD-10-CM | POA: Insufficient documentation

## 2013-01-03 DIAGNOSIS — Z8719 Personal history of other diseases of the digestive system: Secondary | ICD-10-CM | POA: Insufficient documentation

## 2013-01-03 DIAGNOSIS — R0602 Shortness of breath: Secondary | ICD-10-CM

## 2013-01-03 DIAGNOSIS — Z87891 Personal history of nicotine dependence: Secondary | ICD-10-CM | POA: Insufficient documentation

## 2013-01-03 DIAGNOSIS — R000 Tachycardia, unspecified: Secondary | ICD-10-CM | POA: Diagnosis present

## 2013-01-03 DIAGNOSIS — Z951 Presence of aortocoronary bypass graft: Secondary | ICD-10-CM | POA: Insufficient documentation

## 2013-01-03 LAB — BASIC METABOLIC PANEL
BUN: 17 mg/dL (ref 6–23)
CO2: 26 mEq/L (ref 19–32)
Calcium: 8.7 mg/dL (ref 8.4–10.5)
Chloride: 100 mEq/L (ref 96–112)
Creatinine, Ser: 0.88 mg/dL (ref 0.50–1.35)
GFR calc Af Amer: >90 mL/min (ref >90)
GFR calc non Af Amer: 78 mL/min — ABNORMAL LOW (ref >90)
Glucose, Bld: 114 mg/dL — ABNORMAL HIGH (ref 70–99)
Potassium: 4.5 mEq/L (ref 3.5–5.1)
Sodium: 138 mEq/L (ref 135–145)

## 2013-01-03 LAB — CBC
HCT: 38.2 % — ABNORMAL LOW (ref 39.0–52.0)
Hemoglobin: 12.7 g/dL — ABNORMAL LOW (ref 13.0–17.0)
MCH: 30.2 pg (ref 26.0–34.0)
RBC: 4.2 MIL/uL — ABNORMAL LOW (ref 4.22–5.81)

## 2013-01-03 LAB — MAGNESIUM: Magnesium: 1.9 mg/dL (ref 1.5–2.5)

## 2013-01-03 MED ORDER — METOPROLOL TARTRATE 1 MG/ML IV SOLN
5.0000 mg | Freq: Once | INTRAVENOUS | Status: AC
  Administered 2013-01-03: 5 mg via INTRAVENOUS
  Filled 2013-01-03: qty 5

## 2013-01-03 NOTE — ED Provider Notes (Signed)
CSN: [id number]     Arrival date & time 01/03/13  1928 History   First MD Initiated Contact with Patient 01/03/13 1940     Chief Complaint  Patient presents with  . Tachycardia   (Consider location/radiation/quality/duration/timing/severity/associated sxs/prior Treatment) Patient is a 79 y.o. male presenting with palpitations. The history is provided by the patient. No language interpreter was used.  Palpitations Palpitations quality:  Irregular Onset quality:  Sudden Duration:  7 hours Timing:  Constant Progression:  Unchanged Chronicity:  New Context comment:  Hx of afib Relieved by:  Nothing Worsened by:  Nothing tried Ineffective treatments:  None tried Associated symptoms: shortness of breath   Associated symptoms: no chest pain, no cough, no nausea and no vomiting   Risk factors: hx of atrial fibrillation   Risk factors: no hx of PE and no hypercoagulable state     Past Medical History  Diagnosis Date  . Aortic arch aneurysm   . Aortic valve disorder   . HTN (hypertension)   . Hyperlipidemia   . Metastatic melanoma 07/06/2012    to left adrenal gland and left lung; BRAF mutation negative.   . Lung mass 07/06/2012  . Melanoma 08/23/2005    Resected by Dr. Jorja Loa.  Clarks level IV; Breslows 1.62mm; no ulcertaion. Positive margins.  No further info as to whether he had re-excision.   . Atrial fibrillation   . Right inguinal hernia   . Urothelial cancer 07/26/2012  . Ulcerative (chronic) enterocolitis    Past Surgical History  Procedure Laterality Date  . Aortic valve surgery    . Aortic arch repair    . Thoracotomy Left 1977    small partial resection, for "clot?"  . Coronary artery bypass graft  2000    x 4  . Inguinal hernia repair Bilateral yrs ago  . Cystoscopy with retrograde pyelogram, ureteroscopy and stent placement Right 07/23/2012    Procedure: CYSTOSCOPY WITH RETROGRADE PYELOGRAM, URETEROSCOPY WITH RESECTIOIN OF RENAL PELVIC TUMOR AND RIGHT STENT PLACEMENT ;   Surgeon: Crecencio Mc, MD;  Location: [facility];  Service: Urology;  Laterality: Right;  . Holmium laser application Right 07/23/2012    Procedure: HOLMIUM LASER APPLICATION OF RIGHT RENAL PELVIC TUMOR;  Surgeon: Crecencio Mc, MD;  Location: [facility];  Service: Urology;  Laterality: Right;  . Flexible sigmoidoscopy N/A 09/18/2012    Procedure: FLEXIBLE SIGMOIDOSCOPY;  Surgeon: Charolett Bumpers, MD;  Location: [facility];  Service: Endoscopy;  Laterality: N/A;   Family History  Problem Relation Age of Onset  . Alzheimer's disease Mother   . Stroke Father   . Hypertension Father   . Cancer Sister     breast cancer;   . Cancer Brother     bladder cancer; lung cancer; melanoma.    History  Substance Use Topics  . Smoking status: Former Smoker -- 1.00 packs/day for 25 years    Types: Cigarettes    Quit date: 04/19/1975  . Smokeless tobacco: Never Used  . Alcohol Use: No     Comment: Remote history    Review of Systems  Constitutional: Negative for fever.  HENT: Negative for congestion, sore throat and rhinorrhea.   Respiratory: Positive for shortness of breath. Negative for cough.   Cardiovascular: Negative for chest pain and palpitations.  Gastrointestinal: Negative for nausea, vomiting, abdominal pain and diarrhea.  Genitourinary: Negative for dysuria and hematuria.  Skin: Negative for rash.  Neurological: Negative for syncope, light-headedness and headaches.  All other systems reviewed and are negative.  Allergies  Review of patient's allergies indicates no known allergies.  Home Medications   Current Outpatient Rx  Name  Route  Sig  Dispense  Refill  . cholecalciferol (VITAMIN D) 1000 UNITS tablet   Oral   Take 1,000 Units by mouth daily.         . diphenoxylate-atropine (LOMOTIL) 2.5-0.025 MG per tablet   Oral   Take 2 tablets by mouth 4 (four) times daily as needed for diarrhea or loose stools.   60 tablet   1   . metoprolol (LOPRESSOR) 50 MG tablet   Oral   Take  0.5 tablets (25 mg total) by mouth daily before breakfast.   60 tablet   1   . pantoprazole (PROTONIX) 40 MG tablet   Oral   Take 1 tablet (40 mg total) by mouth daily.   30 tablet   1   . predniSONE (DELTASONE) 20 MG tablet   Oral   Take 2 tablets (40 mg total) by mouth daily.   60 tablet   0   . rosuvastatin (CRESTOR) 20 MG tablet   Oral   Take 20 mg by mouth daily before breakfast.          . warfarin (COUMADIN) 5 MG tablet   Oral   Take 7.5 mg by mouth daily.          BP 106/65  Pulse 144  Temp(Src) 98.1 F (36.7 C) (Oral)  Resp 28  SpO2 96% Physical Exam  Nursing note and vitals reviewed. Constitutional: He is oriented to person, place, and time. He appears well-developed and well-nourished.  HENT:  Head: Normocephalic and atraumatic.  Right Ear: External ear normal.  Left Ear: External ear normal.  Eyes: EOM are normal.  Neck: Normal range of motion. Neck supple.  Cardiovascular: Normal heart sounds and intact distal pulses.  Exam reveals no gallop and no friction rub.   No murmur heard. Tachycardia, irreg irreg  Pulmonary/Chest: Effort normal and breath sounds normal. No respiratory distress. He has no wheezes. He has no rales. He exhibits no tenderness.  Diminished breath sounds globally  Abdominal: Soft. Bowel sounds are normal. He exhibits no distension. There is no tenderness. There is no rebound.  Musculoskeletal: Normal range of motion. He exhibits no edema and no tenderness.  Lymphadenopathy:    He has no cervical adenopathy.  Neurological: He is alert and oriented to person, place, and time.  Skin: Skin is warm. No rash noted.  Psychiatric: He has a normal mood and affect. His behavior is normal.    ED Course  Procedures (including critical care time) Labs Review Labs Reviewed  CBC - Abnormal; Notable for the following:    WBC 14.2 (*)    RBC 4.20 (*)    Hemoglobin 12.7 (*)    HCT 38.2 (*)    RDW 16.3 (*)    All other components  within normal limits  BASIC METABOLIC PANEL - Abnormal; Notable for the following:    Glucose, Bld 114 (*)    GFR calc non Af Amer 78 (*)    All other components within normal limits  MAGNESIUM   Imaging Review Dg Chest Port 1 View  01/03/2013   CLINICAL DATA:  Shortness of breath and tachycardia.  EXAM: PORTABLE CHEST - 1 VIEW  COMPARISON:  10/22/2012 and CT from 10/23/2012  FINDINGS: Again noted is a density along the left cardiac border consistent with the patient's known mass. There are increased interstitial densities in both lungs  concerning for edema. Cannot exclude pleural effusions. Heart size upper limits of normal. Again noted are median sternotomy wires and evidence of an aortic valve procedure.  IMPRESSION: Bilateral interstitial densities are concerning for edema and cannot exclude pleural effusions.  Known lesion in the left lower lung.   Electronically Signed   By: Richarda Overlie M.D.   On: 01/03/2013 20:25    MDM   1. Atrial fibrillation with RVR   2. Shortness of breath    7:55 PM Pt is a 79 y.o. male with pertinent PMHX of HTN, HLD, AVR, Afib not on coumadin secondary to fall risk, UC who presents to the ED with shortness of breath. Pt presented with shortness of breath since noon today. Deneis fevers, cough, congestion, runny nose. No sick contacts. Denies chest pain Symptoms worse with exertion. Recently taken off coumadin for fall risk. Currently on hospice for stage IV Lung cancer.  On exam: AF, tachycardic, irreg, irreg, HDS. Plan for CBC, BMP, troponin, mag, EKG and CXR AP portable to rule out electrolyte abnormalities. Will give 5 mg of Lopressor  EKG personally reviewed by myself showed afib w RVR, LBBB Rate of 151, PR NAms, QRS QT/QTC 334/592ms, normal axis, without evidence of new ischemia. Comparison showed similar with a rate of 102, indication: Afib, shortness of breath  CXR AP portable per my read for shortness of breath showed lesion in the left lower lung  and pleural effusions.  On re-eval pt's HR down to 100s after 5 mg of lopressor, shortness of breath improved  Review of labs: Mag within normal limits. BMP showed no electrolyte abnormalities. CBC showed no anemia. Given no electrolyte abnormalities plan for discharge home.  10:18 PM:  I have discussed the diagnosis/risks/treatment options with the patient and family and believe the pt to be eligible for discharge home to follow-up with PCP and cardiologist as needed. We also discussed returning to the ED immediately if new or worsening sx occur. We discussed the sx which are most concerning (e.g., worsening symptoms) that necessitate immediate return. Any new prescriptions provided to the patient are listed below.   New Prescriptions   No medications on file    The patient appears reasonably screened and/or stabilized for discharge and I doubt any other medical condition or other [facility] requiring further screening, evaluation or treatment in the ED at this time prior to discharge . Pt in agreement with discharge plan. Return precautions given. Pt discharged VSS  Labs, EKG and imaging reviewed by myself and considered in medical decision making if ordered.  Imaging interpreted by radiology. Pt was discussed with my attending, Dr. Kavin Leech, MD 01/04/13 0025

## 2013-01-03 NOTE — ED Notes (Signed)
Hospice care for x 2 months. Hospice dr. And rn came out yesterday and pt. To be in afib.  He is feeling weaker today. Pt. Took off coumadin yesterday due to high inr. To start on asa this week. pcp took him off lopressor. ? Weaning off meds. Just taking bp meds.

## 2013-01-03 NOTE — ED Notes (Signed)
Pt is a hospice patient with stage 4 lung cancer. Brought to the ER due to afib with RVR. HR as high as 170's per daughter. Pt has a history of Afib but has never had a HR that high. Pt alert and oriented x 4, neuro intact. Denies pain at this time.

## 2013-01-04 NOTE — ED Provider Notes (Signed)
I saw and evaluated the patient, reviewed the resident's note and I agree with the findings and plan.   Patient from hospice with symptomatic Afib with RVR. Appears well here, sx resolved with one dose of IV lopressor. As patient is hospice and only wants symptomatic care, we discussed options with patient and he and family prefer to go home.  Audree Camel, MD 01/04/13 1224

## 2013-02-16 DEATH — deceased

## 2013-06-18 IMAGING — CR DG CHEST 2V
2 series · 2 of 2 positions shown · non-contrast
Comparison: Chest x-ray and 06/19/2012.

CLINICAL DATA: Preoperative study prior to cystoscopy and
retrograde pyelography.

CHEST - 2 VIEW

[w chest pa]
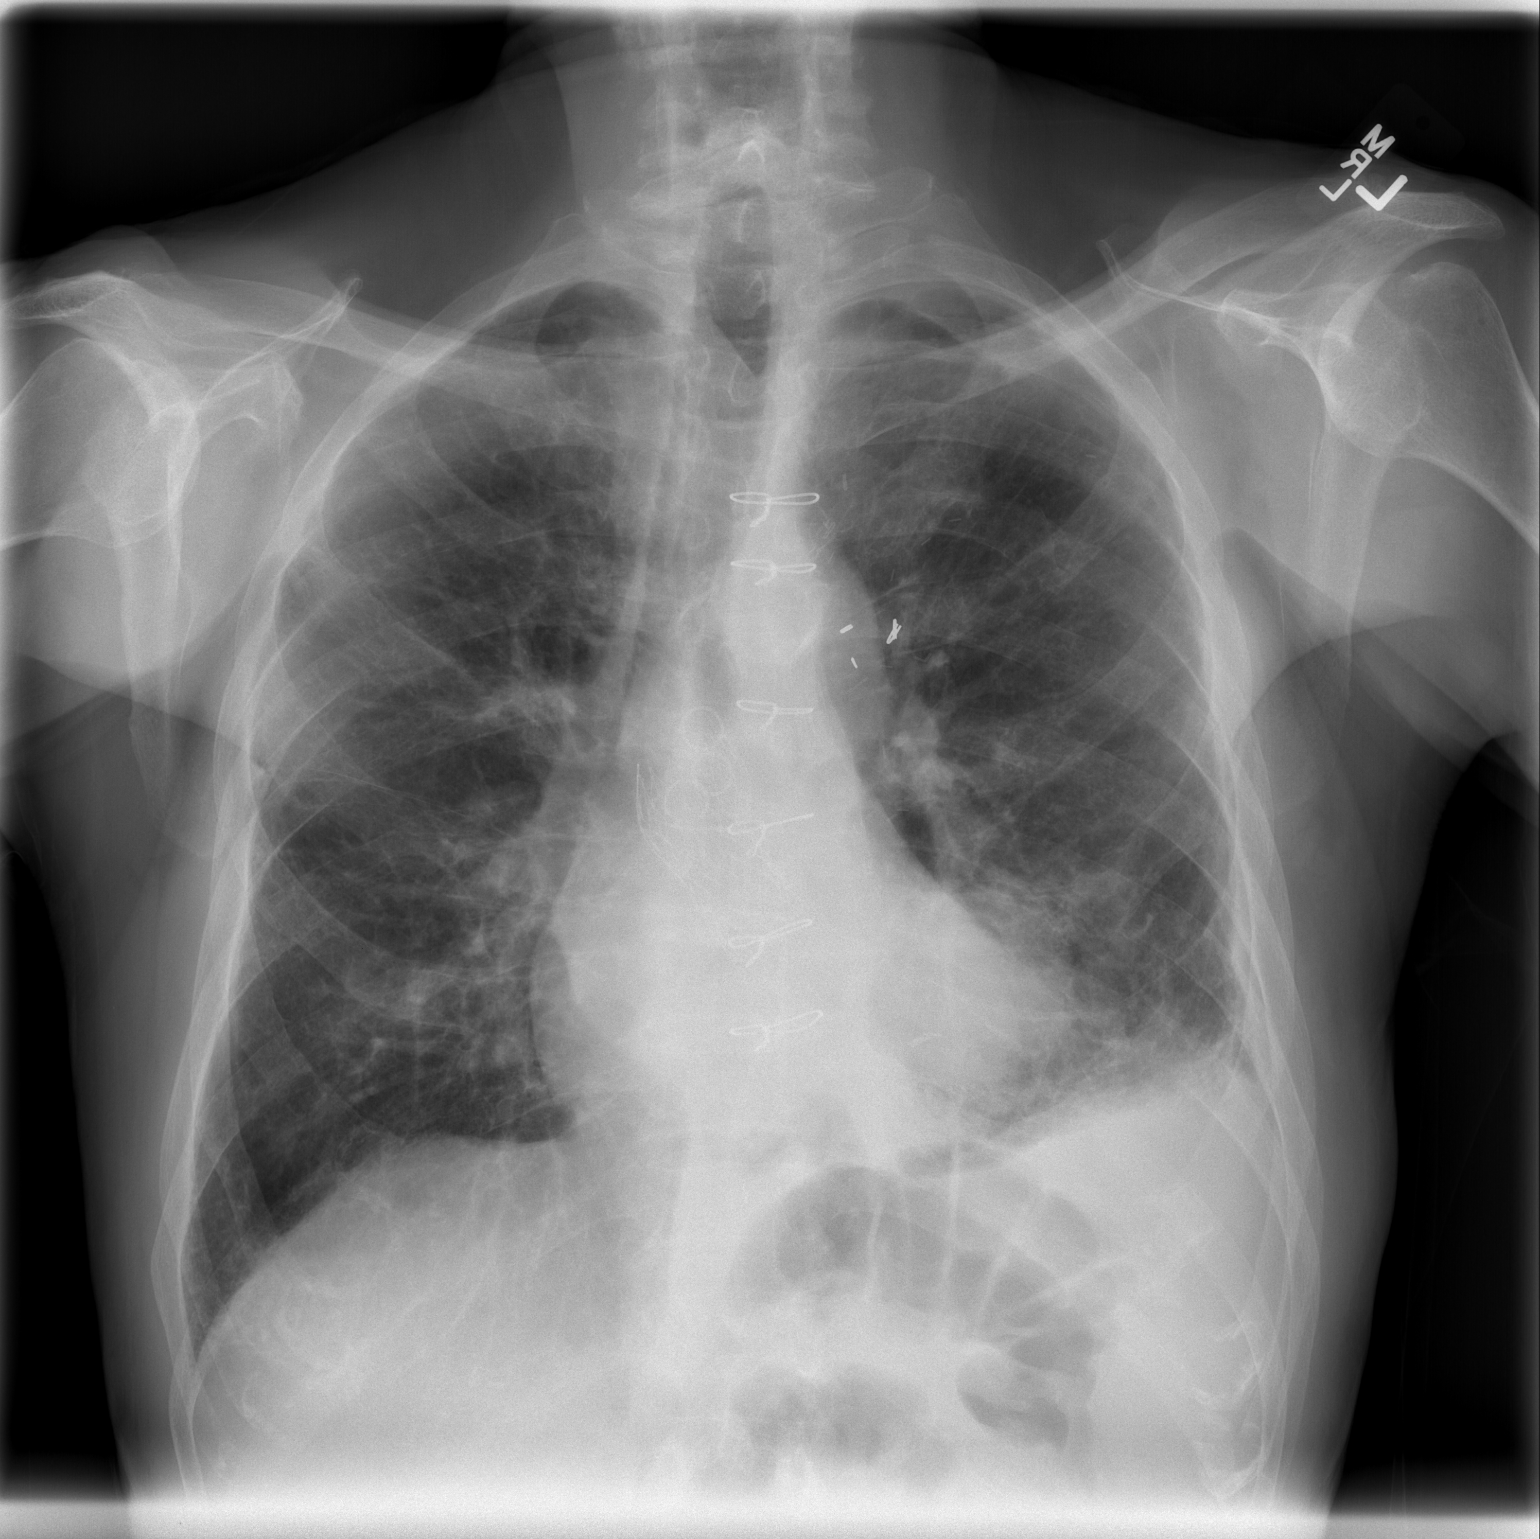

[w chest lat]
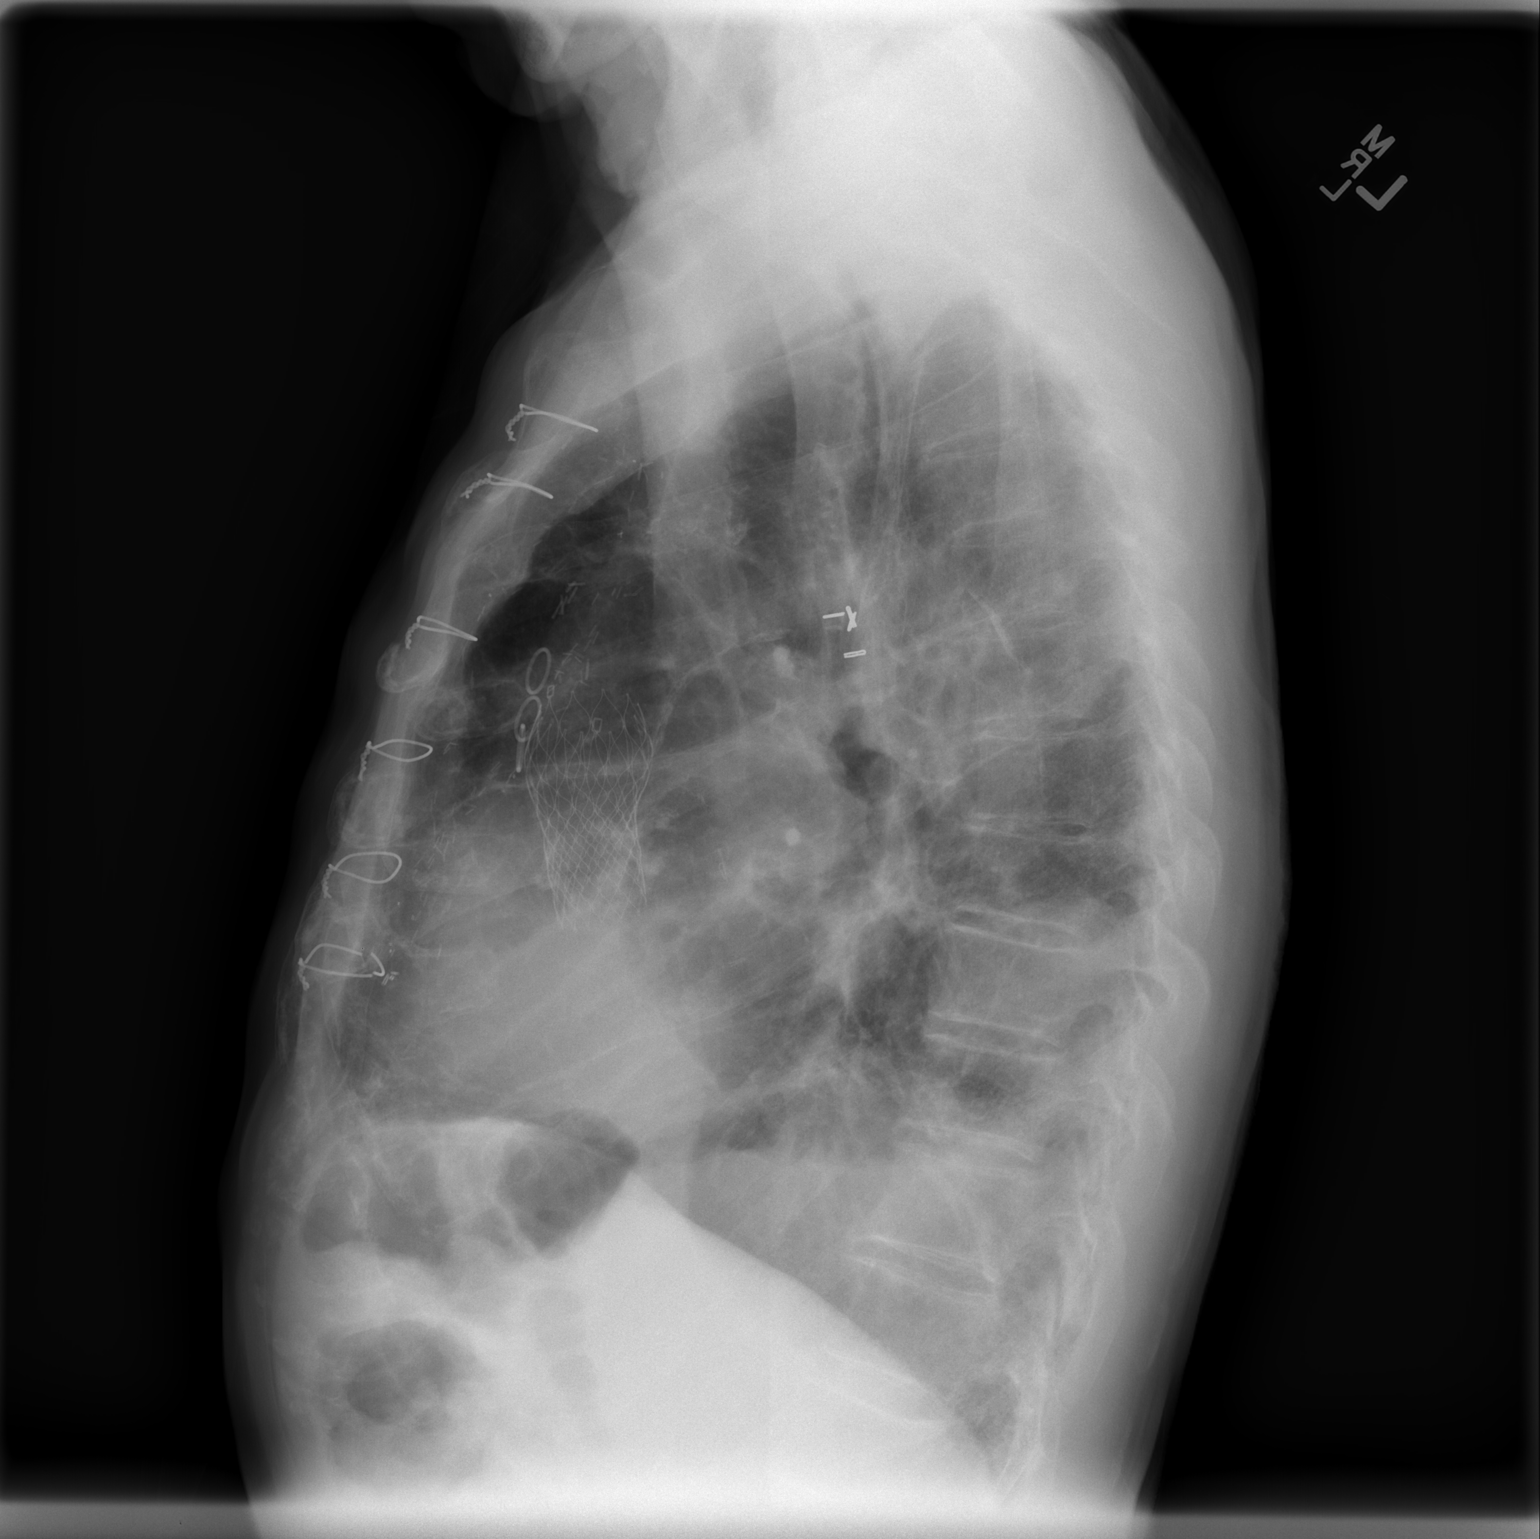

[2 of 2 positions shown; findings below may reference images not displayed]

FINDINGS: Left retrocardiac opacities compatible with the known
mass in the left lower lobe.  Moderate left pleural effusion.
Right lung appears clear.  No evidence of pulmonary edema.  Heart
size is normal.  A CoreValve device is noted in position in the
aortic root and proximal descending thoracic aorta.  Status post
median sternotomy for CABG.  Atherosclerotic calcifications within
the thoracic aorta.
IMPRESSION: 1.  Known left lower lobe mass is poorly visualized on this plain
film examination.
2.  Moderate left-sided pleural effusion.
3.  Atherosclerosis.
4.  Postoperative changes of CABG and post procedural changes of
CoreValve placement, as above.

## 2013-06-25 ENCOUNTER — Encounter: Payer: Self-pay | Admitting: Interventional Cardiology

## 2013-07-02 ENCOUNTER — Ambulatory Visit: Payer: Medicare Other | Admitting: Interventional Cardiology

## 2013-08-23 IMAGING — CR DG CHEST 1V PORT
1 series · 1 of 1 positions shown · non-contrast
Comparison: 07/19/2012

CLINICAL DATA: Shortness of breath.  Weakness.

PORTABLE CHEST - 1 VIEW

[AP]
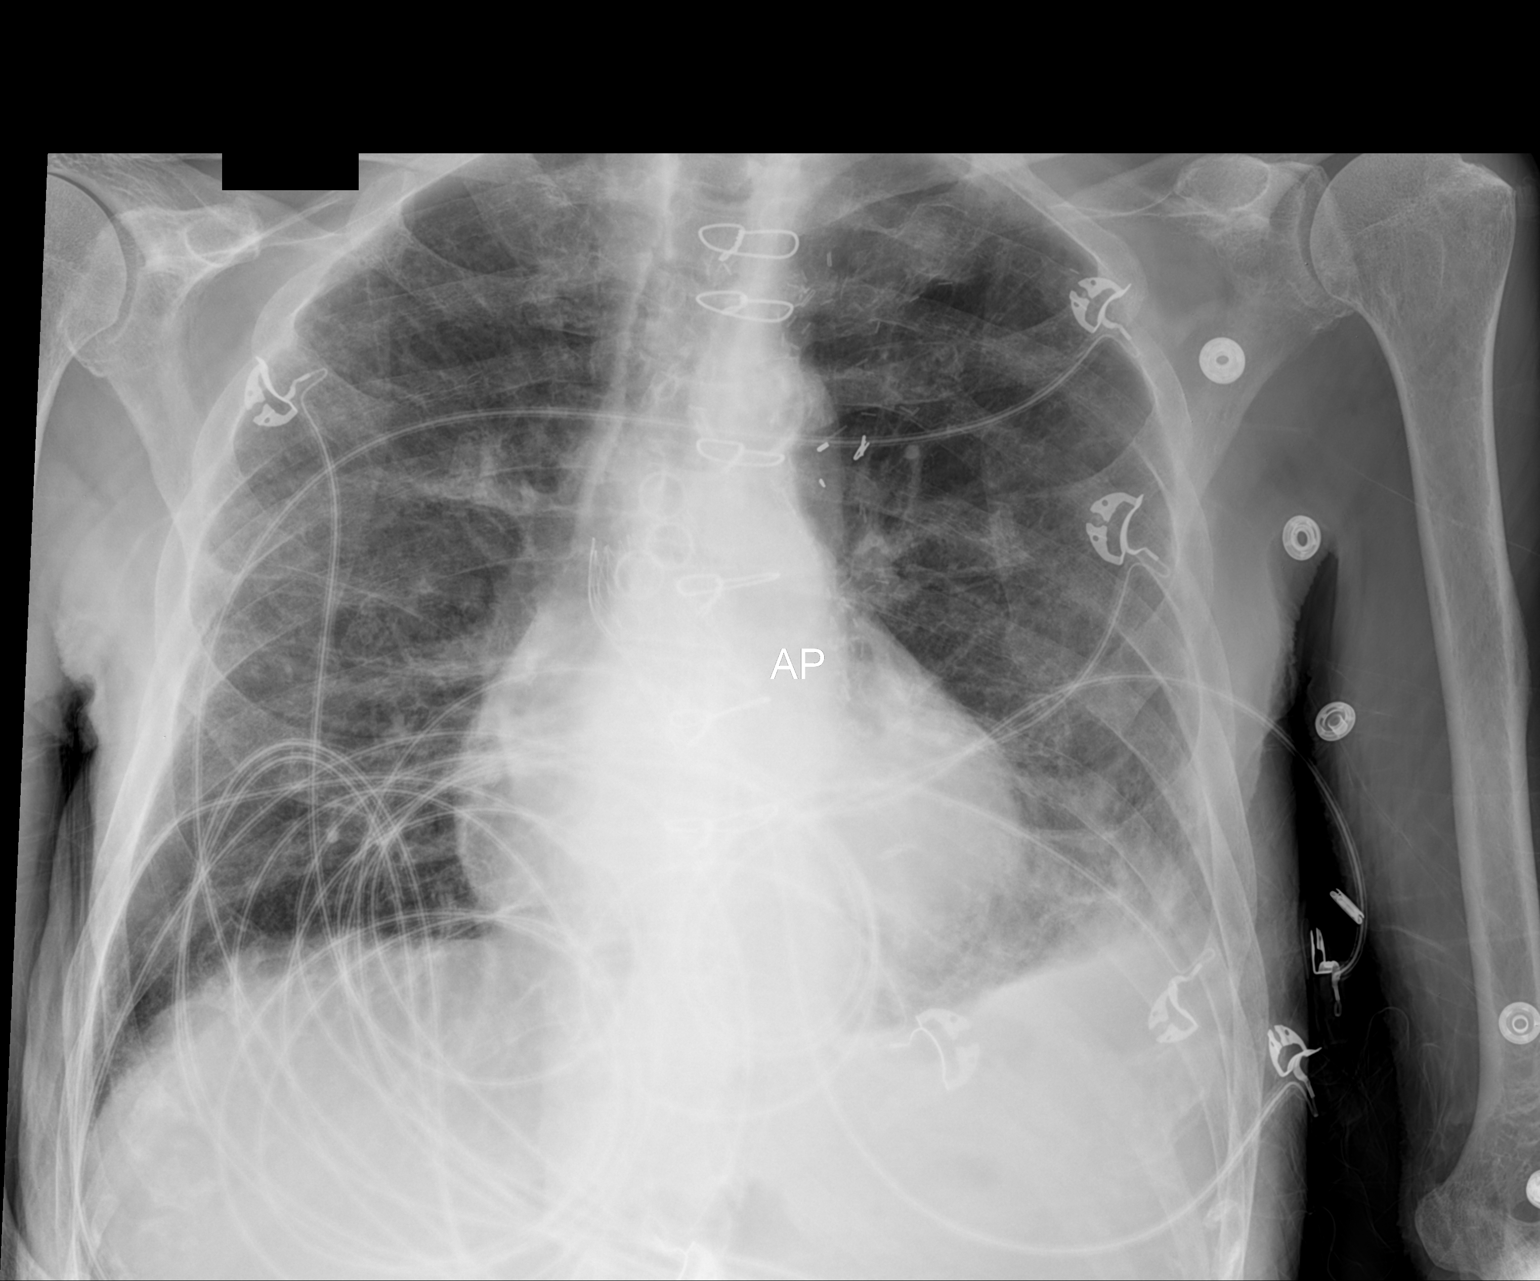

[1 of 1 positions shown; findings below may reference images not displayed]

FINDINGS: The patient has had median sternotomy CABG.  Aortic stent
graft is again identified.  There is density at the left lung base,
similar appearance to multiple prior studies, consistent with mass
based on prior CT.  Prominent interstitial markings appear chronic
and stable.
IMPRESSION: 1.  Stable appearance the chest and
2.  Persistent left lower lobe density, consistent with known mass.

## 2013-08-29 ENCOUNTER — Ambulatory Visit: Payer: Medicare Other | Admitting: Interventional Cardiology

## 2013-09-14 IMAGING — CR DG CHEST 2V
2 series · 2 of 2 positions shown · non-contrast
Comparison: 09/23/2012 radiographs.  CT 07/19/2012.

CLINICAL DATA: Increased dyspnea with cough for 3 weeks.  History
of melanoma with metastatic disease and pleural effusion.

CHEST - 2 VIEW

[w chest pa]
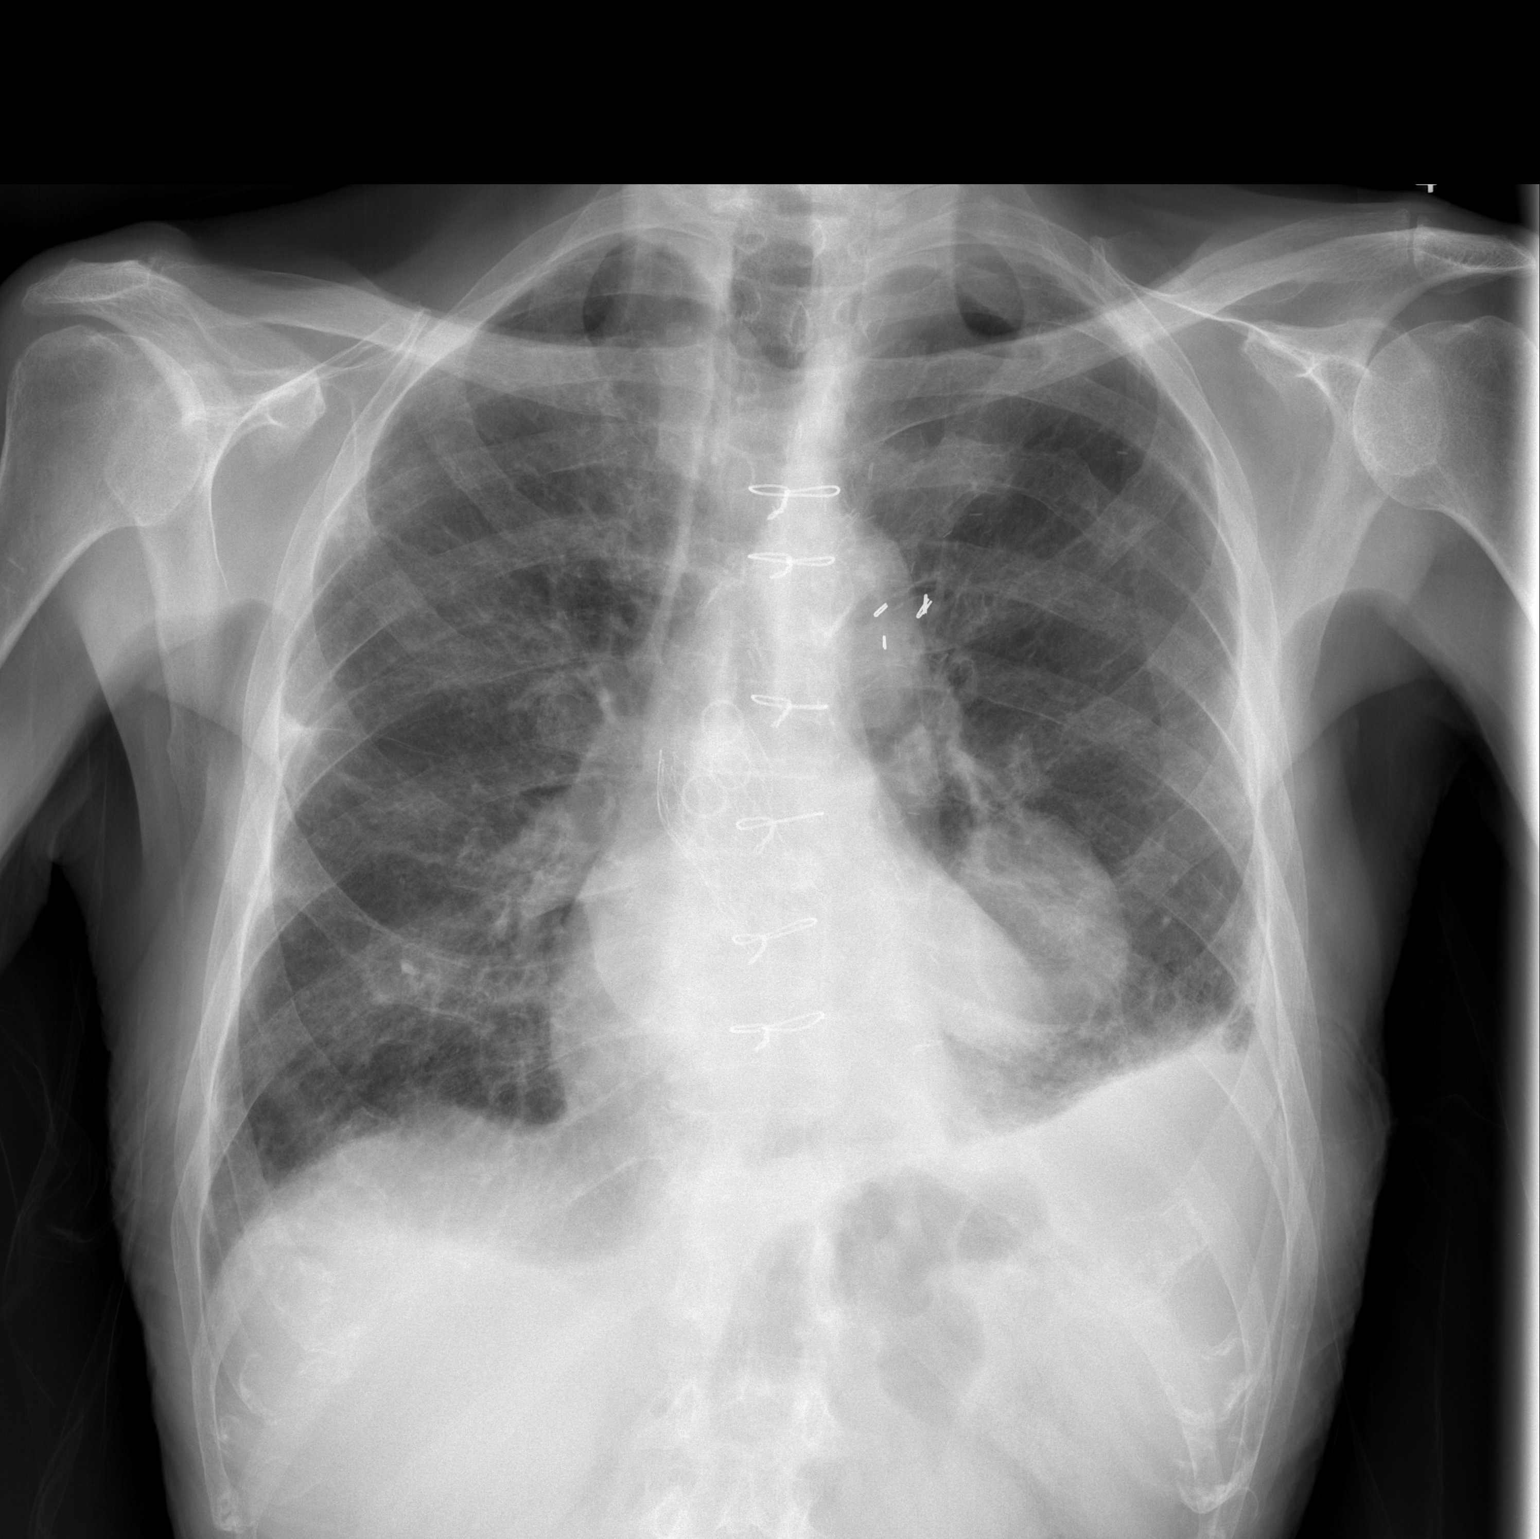

[w chest lat]
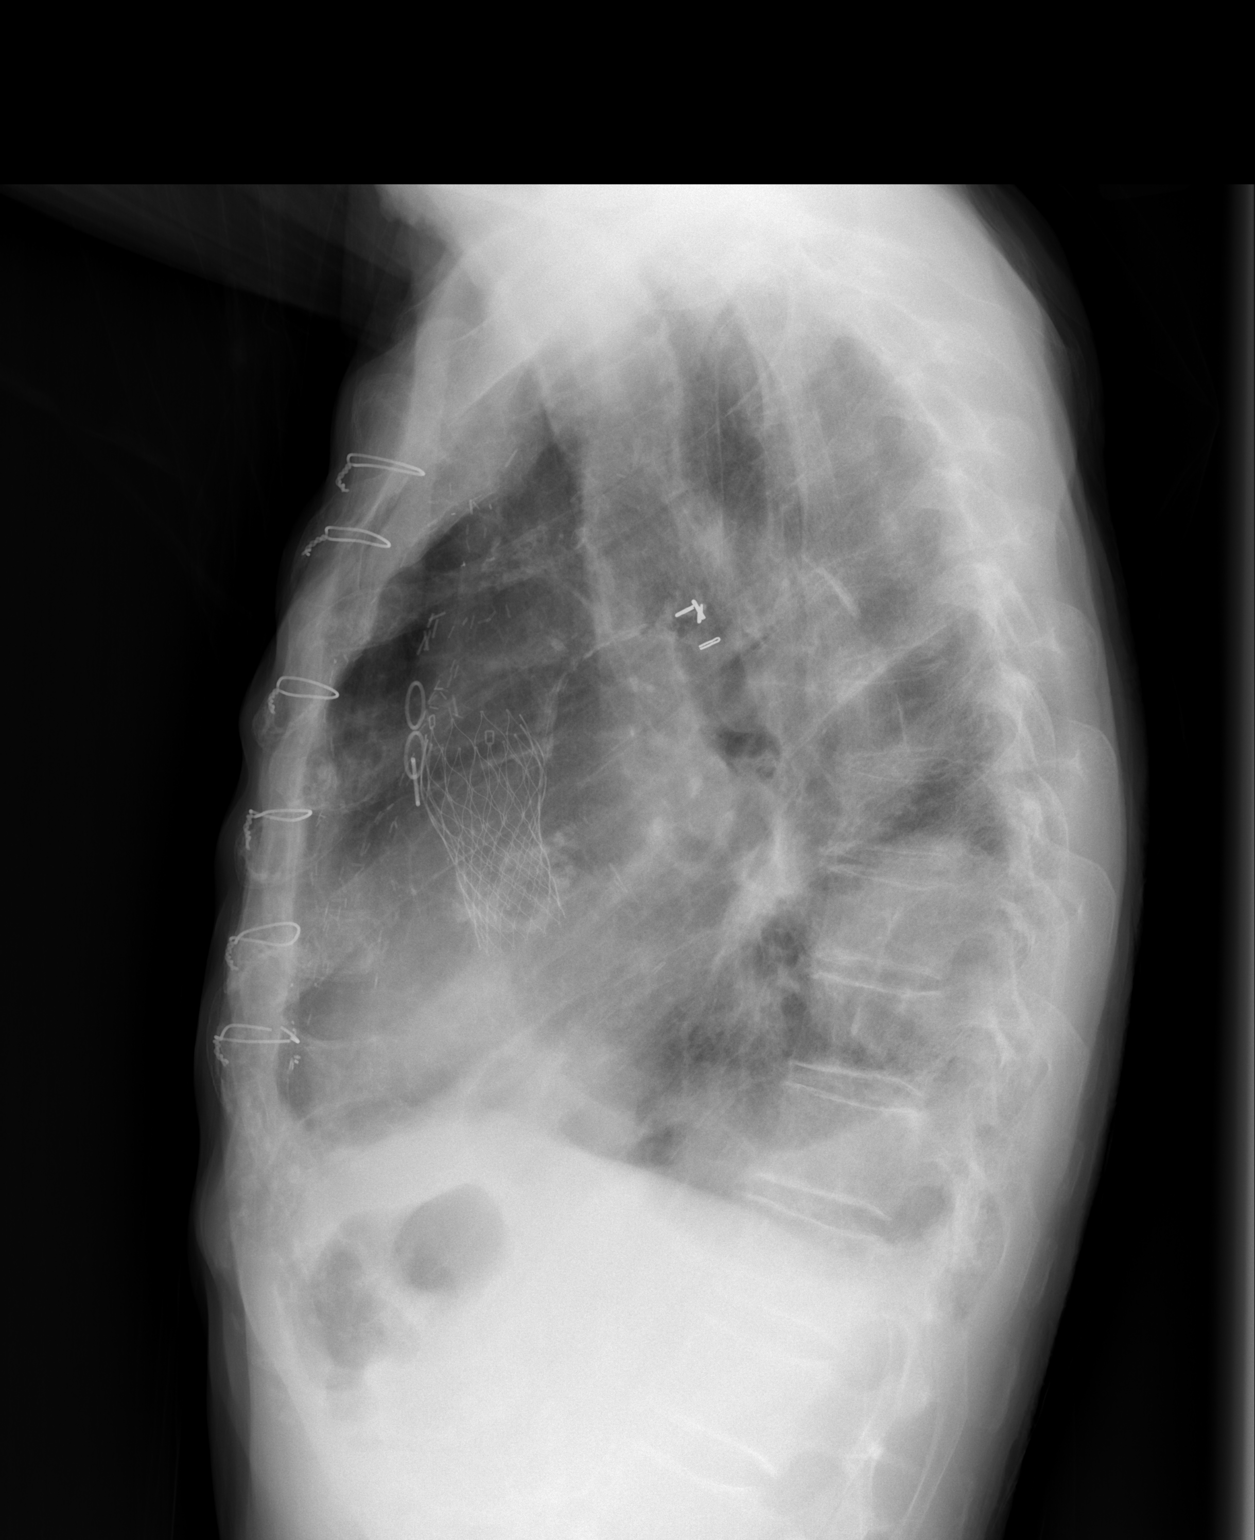

[2 of 2 positions shown; findings below may reference images not displayed]

FINDINGS: The heart size and mediastinal contours are stable status
post median sternotomy and ascending aortic stent grafting.  The
known left lower lobe mass has enlarged, measuring 6.6 cm in
diameter.  The left pleural effusion and left basilar atelectasis
are stable.  The right lung appears clear.  Generalized
interstitial prominence is stable without focal airspace disease.
IMPRESSION: Enlarging left lower lobe mass (known metastatic disease on prior
biopsy).  Similar generalized interstitial prominence and left
pleural effusion.

## 2013-09-21 IMAGING — US US THORACENTESIS ASP PLEURAL SPACE W/IMG GUIDE
1 series · 3 of 3 positions shown · non-contrast
Comparison: None

CLINICAL DATA: Dyspnea, left lunf mass, left pleural effusion

ULTRASOUND GUIDED LEFT THORACENTESIS

[Series 1: us thoracentesis asp pleural space w/img guide · 0.32mm/px · 3 of 3 slices shown]
[im 1/3]
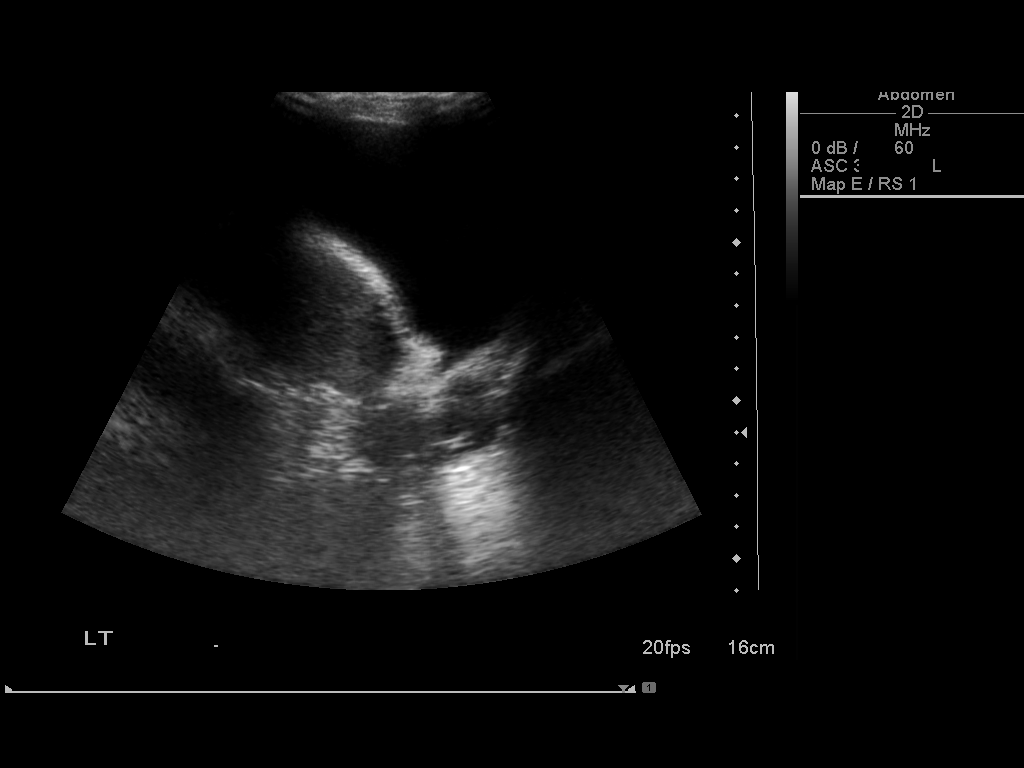
[im 2/3]
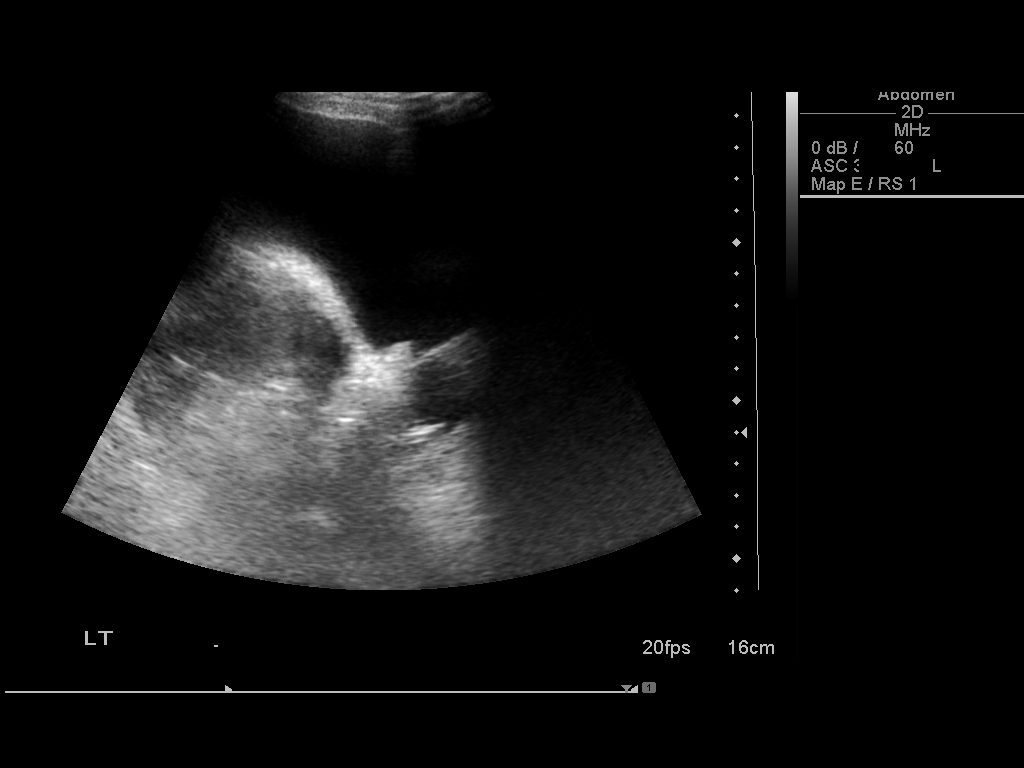
[im 3/3]
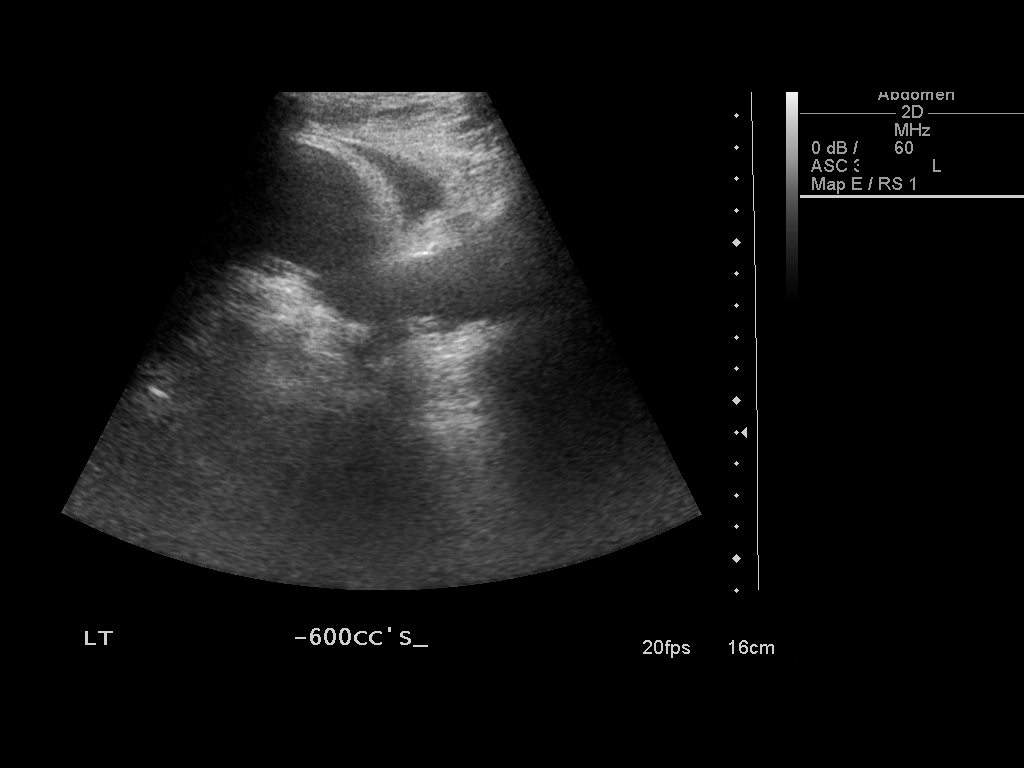

[3 of 3 positions shown; findings below may reference images not displayed]

An ultrasound guided thoracentesis was thoroughly discussed with
the patient and questions answered.  The benefits, risks,
alternatives and complications were also discussed.  The patient
understands and wishes to proceed with the procedure.  Written
consent was obtained.

Ultrasound was performed to localize and mark an adequate pocket of
fluid in the left chest.  The area was then prepped and draped in
the normal sterile fashion.  1% Lidocaine was used for local
anesthesia.  Under ultrasound guidance a 19 gauge Yueh catheter was
introduced.  Thoracentesis was performed.  The catheter was removed
and a dressing applied.

Complications:  None immediate
FINDINGS: A total of approximately 600mL of clear yellow fluid was
removed. A fluid sample was sent for laboratory analysis.
IMPRESSION: Successful ultrasound guided left thoracentesis yielding 600mL of
pleural fluid.

Read by Evends Chapotin

## 2013-10-01 ENCOUNTER — Ambulatory Visit: Payer: Medicare Other | Admitting: Interventional Cardiology
